# Patient Record
Sex: Male | Born: 1980
Health system: Southern US, Community
[De-identification: ages and names within clinical notes are randomized; demographics above are authoritative.]

## PROBLEM LIST (undated history)

## (undated) DIAGNOSIS — D696 Thrombocytopenia, unspecified: Secondary | ICD-10-CM

## (undated) DIAGNOSIS — E559 Vitamin D deficiency, unspecified: Secondary | ICD-10-CM

## (undated) HISTORY — DX: Vitamin D deficiency, unspecified: E55.9

## (undated) HISTORY — DX: Thrombocytopenia, unspecified: D69.6

---

## 2008-01-18 ENCOUNTER — Encounter: Admission: RE | Admit: 2008-01-18 | Discharge: 2008-01-18 | Payer: Self-pay | Admitting: Internal Medicine

## 2015-07-04 ENCOUNTER — Other Ambulatory Visit: Payer: 59

## 2015-07-04 ENCOUNTER — Other Ambulatory Visit (INDEPENDENT_AMBULATORY_CARE_PROVIDER_SITE_OTHER): Payer: 59

## 2015-07-04 ENCOUNTER — Encounter: Payer: Self-pay | Admitting: Internal Medicine

## 2015-07-04 ENCOUNTER — Ambulatory Visit (INDEPENDENT_AMBULATORY_CARE_PROVIDER_SITE_OTHER): Payer: 59 | Admitting: Internal Medicine

## 2015-07-04 VITALS — BP 120/68 | HR 78 | Temp 98.2°F | Resp 12 | Ht 65.0 in | Wt 159.0 lb

## 2015-07-04 DIAGNOSIS — D696 Thrombocytopenia, unspecified: Secondary | ICD-10-CM

## 2015-07-04 DIAGNOSIS — Z0000 Encounter for general adult medical examination without abnormal findings: Secondary | ICD-10-CM

## 2015-07-04 DIAGNOSIS — E559 Vitamin D deficiency, unspecified: Secondary | ICD-10-CM | POA: Diagnosis not present

## 2015-07-04 LAB — COMPREHENSIVE METABOLIC PANEL
ALBUMIN: 4.4 g/dL (ref 3.5–5.2)
ALK PHOS: 65 U/L (ref 39–117)
ALT: 23 U/L (ref 0–53)
AST: 20 U/L (ref 0–37)
BILIRUBIN TOTAL: 0.4 mg/dL (ref 0.2–1.2)
BUN: 14 mg/dL (ref 6–23)
CO2: 31 mEq/L (ref 19–32)
Calcium: 9.7 mg/dL (ref 8.4–10.5)
Chloride: 104 mEq/L (ref 96–112)
Creatinine, Ser: 0.9 mg/dL (ref 0.40–1.50)
GFR: 123.38 mL/min (ref >60.00)
GLUCOSE: 118 mg/dL — AB (ref 70–99)
Potassium: 4 mEq/L (ref 3.5–5.1)
SODIUM: 142 meq/L (ref 135–145)
TOTAL PROTEIN: 7.1 g/dL (ref 6.0–8.3)

## 2015-07-04 LAB — CBC
HCT: 45.9 % (ref 39.0–52.0)
HEMOGLOBIN: 15.5 g/dL (ref 13.0–17.0)
MCHC: 33.7 g/dL (ref 30.0–36.0)
MCV: 85.1 fl (ref 78.0–100.0)
Platelets: 160 10*3/uL (ref 150.0–400.0)
RBC: 5.39 Mil/uL (ref 4.22–5.81)
RDW: 12.6 % (ref 11.5–15.5)
WBC: 5.2 10*3/uL (ref 4.0–10.5)

## 2015-07-04 LAB — VITAMIN D 25 HYDROXY (VIT D DEFICIENCY, FRACTURES): VITD: 33.19 ng/mL (ref 30.00–100.00)

## 2015-07-04 LAB — HIV ANTIBODY (ROUTINE TESTING W REFLEX): HIV: NONREACTIVE

## 2015-07-04 NOTE — Patient Instructions (Signed)
We will check the labs today and call you back with the results.   If you are signed up for mychart you can get the results that way.   Come back in about 1 year for a check up and if you need anything sooner please feel free to call the office.

## 2015-07-04 NOTE — Progress Notes (Signed)
Pre visit review using our clinic review tool, if applicable. No additional management support is needed unless otherwise documented below in the visit note. 

## 2015-07-07 DIAGNOSIS — D693 Immune thrombocytopenic purpura: Secondary | ICD-10-CM | POA: Insufficient documentation

## 2015-07-07 DIAGNOSIS — E559 Vitamin D deficiency, unspecified: Secondary | ICD-10-CM | POA: Insufficient documentation

## 2015-07-07 NOTE — Assessment & Plan Note (Signed)
Unclear etiology and mildly decreased in the past. Rechecking today and screening for HIV, no easy bleeding or bruising. No family history of blood dyscrasias.

## 2015-07-07 NOTE — Progress Notes (Signed)
   Subjective:    Patient ID: Louis Jennings, male    DOB: [birth year], 34 y.o.   MRN: [id number]  HPI The patient is a 34 YO male coming in for follow up of his vit d deficiency and mild low platelets. Previously evaluated and nothing found to explain. No easy bleeding or bruising. Does not take any medications. 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   [facility], social history reviewed and updated.   Review of Systems  Constitutional: Negative for fever, activity change, appetite change and fatigue.  HENT: Negative.   Eyes: Negative.   Respiratory: Negative for cough, chest tightness, shortness of breath and wheezing.   Cardiovascular: Negative for chest pain, palpitations and leg swelling.  Gastrointestinal: Negative for nausea, abdominal pain, diarrhea, constipation and abdominal distention.  Musculoskeletal: Negative.   Skin: Negative.   Neurological: Negative.   Hematological: Negative.   Psychiatric/Behavioral: Negative.       Objective:   Physical Exam  Constitutional: He is oriented to person, place, and time. He appears well-developed and well-nourished.  HENT:  Head: Normocephalic and atraumatic.  Eyes: EOM are normal.  Neck: Normal range of motion.  Cardiovascular: Normal rate and regular rhythm.   No murmur heard. Pulmonary/Chest: Effort normal and breath sounds normal. No respiratory distress. He has no wheezes. He has no rales.  Abdominal: Soft. Bowel sounds are normal. He exhibits no distension. There is no tenderness. There is no rebound.  Musculoskeletal: He exhibits no edema.  Neurological: He is alert and oriented to person, place, and time. Coordination normal.  Skin: Skin is warm and dry.  Psychiatric: He has a normal mood and affect.   Filed Vitals:   07/04/15 1423  BP: 120/68  Pulse: 78  Temp: 98.2 F (36.8 C)  TempSrc: Oral  Resp: 12  Height: 5\' 5"  (1.651 m)  Weight: 159 lb (72.122 kg)  SpO2: 97%      Assessment & Plan:

## 2015-07-07 NOTE — Assessment & Plan Note (Signed)
Previously on high dose replacement and was supposed to be taking oral supplement daily which he is not right now. Checking level and may advise to get back on replacement.

## 2016-06-26 DIAGNOSIS — H5213 Myopia, bilateral: Secondary | ICD-10-CM | POA: Diagnosis not present

## 2017-07-10 DIAGNOSIS — H5213 Myopia, bilateral: Secondary | ICD-10-CM | POA: Diagnosis not present

## 2017-10-21 ENCOUNTER — Telehealth: Payer: Self-pay | Admitting: *Deleted

## 2017-10-21 NOTE — Telephone Encounter (Signed)
Please schedule

## 2017-10-21 NOTE — Telephone Encounter (Signed)
Fine

## 2017-10-21 NOTE — Telephone Encounter (Signed)
Pt states that he will call back to schedule his NP appt with Dr. Mardelle MatteAndy.

## 2017-10-21 NOTE — Telephone Encounter (Signed)
Copied from CRM [phone]. Topic: Inquiry >> Oct 21, 2017 10:46 AM Yvonna Alanisobinson, Andra M wrote: Reason for CRM: Patient called stating that he, along with his wife, would like their PCP care transferred from Dr. Hillard DankerElizabeth Crawford to Dr. Asencion Partridgeamille Andy at  [place]. Plesae contact the patient to transfer care.       Thank You!!!

## 2017-10-30 ENCOUNTER — Encounter: Payer: Self-pay | Admitting: Family Medicine

## 2017-10-30 ENCOUNTER — Ambulatory Visit: Payer: 59 | Admitting: Family Medicine

## 2017-10-30 ENCOUNTER — Other Ambulatory Visit: Payer: Self-pay

## 2017-10-30 VITALS — BP 132/80 | HR 67 | Temp 98.1°F | Ht 65.25 in | Wt 152.6 lb

## 2017-10-30 DIAGNOSIS — E559 Vitamin D deficiency, unspecified: Secondary | ICD-10-CM

## 2017-10-30 DIAGNOSIS — Z0000 Encounter for general adult medical examination without abnormal findings: Secondary | ICD-10-CM | POA: Diagnosis not present

## 2017-10-30 DIAGNOSIS — D693 Immune thrombocytopenic purpura: Secondary | ICD-10-CM | POA: Diagnosis not present

## 2017-10-30 LAB — CBC WITH DIFFERENTIAL/PLATELET
BASOS ABS: 0 10*3/uL (ref 0.0–0.1)
BASOS PCT: 0.4 % (ref 0.0–3.0)
EOS ABS: 0.1 10*3/uL (ref 0.0–0.7)
Eosinophils Relative: 2.5 % (ref 0.0–5.0)
HCT: 45.1 % (ref 39.0–52.0)
Hemoglobin: 15.4 g/dL (ref 13.0–17.0)
LYMPHS PCT: 38.5 % (ref 12.0–46.0)
Lymphs Abs: 1.6 10*3/uL (ref 0.7–4.0)
MCHC: 34.1 g/dL (ref 30.0–36.0)
MCV: 86.1 fl (ref 78.0–100.0)
MONO ABS: 0.2 10*3/uL (ref 0.1–1.0)
Monocytes Relative: 5.4 % (ref 3.0–12.0)
Neutro Abs: 2.2 10*3/uL (ref 1.4–7.7)
Neutrophils Relative %: 53.2 % (ref 43.0–77.0)
Platelets: 139 10*3/uL — ABNORMAL LOW (ref 150.0–400.0)
RBC: 5.24 Mil/uL (ref 4.22–5.81)
RDW: 12.6 % (ref 11.5–15.5)
WBC: 4.1 10*3/uL (ref 4.0–10.5)

## 2017-10-30 LAB — TSH: TSH: 1.06 u[IU]/mL (ref 0.35–4.50)

## 2017-10-30 LAB — COMPREHENSIVE METABOLIC PANEL
ALBUMIN: 4.4 g/dL (ref 3.5–5.2)
ALT: 30 U/L (ref 0–53)
AST: 24 U/L (ref 0–37)
Alkaline Phosphatase: 56 U/L (ref 39–117)
BILIRUBIN TOTAL: 0.6 mg/dL (ref 0.2–1.2)
BUN: 13 mg/dL (ref 6–23)
CHLORIDE: 103 meq/L (ref 96–112)
CO2: 34 meq/L — AB (ref 19–32)
CREATININE: 0.89 mg/dL (ref 0.40–1.50)
Calcium: 9.8 mg/dL (ref 8.4–10.5)
GFR: 123.37 mL/min (ref >60.00)
Glucose, Bld: 80 mg/dL (ref 70–99)
Potassium: 3.9 mEq/L (ref 3.5–5.1)
SODIUM: 142 meq/L (ref 135–145)
Total Protein: 7.1 g/dL (ref 6.0–8.3)

## 2017-10-30 LAB — LIPID PANEL
CHOL/HDL RATIO: 3
Cholesterol: 174 mg/dL (ref 0–200)
HDL: 51.4 mg/dL (ref >39.00)
LDL Cholesterol: 84 mg/dL (ref 0–99)
NonHDL: 122.41
Triglycerides: 190 mg/dL — ABNORMAL HIGH (ref 0.0–149.0)
VLDL: 38 mg/dL (ref 0.0–40.0)

## 2017-10-30 LAB — VITAMIN D 25 HYDROXY (VIT D DEFICIENCY, FRACTURES): VITD: 28.19 ng/mL — AB (ref 30.00–100.00)

## 2017-10-30 NOTE — Patient Instructions (Signed)
Please return in 1 year for your annual complete physical; please come fasting.   It was a pleasure meeting you today! Thank you for choosing us to meet your healthcare needs! I truly look forward to working with you. If you have any questions or concerns, please send me a message via Mychart or call the office at [phone].  Please do these things to maintain good health!   Exercise at least 30-45 minutes a day,  4-5 days a week.   Eat a low-fat diet with lots of fruits and vegetables, up to 7-9 servings per day.  Drink plenty of water daily. Try to drink 8 8oz glasses per day.  Seatbelts can save your life. Always wear your seatbelt.  Place Smoke Detectors on every level of your home and check batteries every year.  Eye Doctor - have an eye exam every 1-2 years  Safe sex - use condoms to protect yourself from STDs if you could be exposed to these types of infections.  Avoid heavy alcohol use. If you drink, keep it to less than 2 drinks/day and not every day.  Health Care Power of Attorney.  Choose someone you trust that could speak for you if you became unable to speak for yourself.  Depression is common in our stressful world.If you're feeling down or losing interest in things you normally enjoy, please come in for a visit.

## 2017-10-30 NOTE — Progress Notes (Signed)
Subjective  Chief Complaint  Patient presents with  . Establish Care    Transfer from Dr. Sharlet Salina   HPI: Louis Jennings is a 37 y.o. male who presents to [place] at [facility] today for a Male Wellness Visit.   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 Originally from [place]; married with 2 children. Happy. No health concerns.   Has h/o mild idiopathic thrombocytopenia. Evaluated by hematology in past. Never with clinical sxs. No bruising or bleeding or rash.  Has h/o vit D deficiency; lack of sunlight as suspected cause; on mvi daily now. No pain.   Lifestyle: Body mass index is 25.2 kg/m. Wt Readings from Last 3 Encounters:  10/30/17 152 lb 9.6 oz (69.2 kg)  07/04/15 159 lb (72.1 kg)   Diet: low fat Exercise: frequently, running/ jogging  Patient Active Problem List   Diagnosis Date Noted  . Vitamin D deficiency 07/07/2015  . Idiopathic thrombocytopenia ([place]) 07/07/2015   Health Maintenance  Topic Date Due  . TETANUS/TDAP  09/09/2020  . INFLUENZA VACCINE  Completed  . HIV Screening  Completed   Immunization History  Administered Date(s) Administered  . Hepatitis B 11/07/2003, 10/23/2007, 06/21/2008  . Influenza,inj,Quad PF,6+ Mos 07/14/2014  . Influenza-Unspecified 10/21/2007, 08/22/2008, 08/22/2008, 07/19/2013, 06/24/2015, 05/21/2017  . MMR 07/01/2007, 02/08/2015  . PPD Test 12/09/2013  . Td 10/21/2007, 01/01/2008, 06/22/2008  . Tdap 09/10/2007, 10/21/2007, 09/09/2010   We updated and reviewed the patient's past history in detail and it is documented below. Allergies: Patient has No Known Allergies. Past Medical History  has a past medical history of Thrombocytopenia ([place]) and Vitamin D deficiency. Past Surgical History  has no past surgical history on file. Social History Patient  reports that  has never  smoked. he has never used smokeless tobacco. He reports that he does not drink alcohol or use drugs. Family History Patient family history includes Healthy in his daughter and son. Review of Systems: Constitutional: negative for fever or malaise Ophthalmic: negative for photophobia, double vision or loss of vision Cardiovascular: negative for chest pain, dyspnea on exertion, or new LE swelling Respiratory: negative for SOB or persistent cough Gastrointestinal: negative for abdominal pain, change in bowel habits or melena Genitourinary: negative for dysuria or gross hematuria Musculoskeletal: negative for new gait disturbance or muscular weakness Integumentary: negative for new or persistent rashes, no breast lumps Neurological: negative for TIA or stroke symptoms Psychiatric: negative for SI or delusions Allergic/Immunologic: negative for hives  Patient Care Team    Relationship Specialty Notifications Start End  Leamon Arnt, MD PCP - General Family Medicine  10/30/17    Objective  Vitals: BP (!) 138/98   Pulse 67   Temp 98.1 F (36.7 C)   Ht 5' 5.25" (1.657 m)   Wt 152 lb 9.6 oz (69.2 kg)   BMI 25.20 kg/m  General:  Well developed, well nourished, no acute distress  Psych:  Alert and orientedx3,normal mood and affect HEENT:  Normocephalic, atraumatic, non-icteric sclera, PERRL, oropharynx is clear without mass or exudate, supple neck without adenopathy, mass or thyromegaly Cardiovascular:  Normal S1, S2, RRR without gallop, rub or murmur, nondisplaced PMI, +2 distal pulses in bilateral upper and lower extremities. Respiratory:  Good breath sounds bilaterally, CTAB with normal respiratory effort Gastrointestinal: normal bowel sounds, soft, non-tender, no noted masses. No HSM MSK: no deformities,  contusions. Joints are without erythema or swelling. Spine and CVA region are nontender Skin:  Warm, no rashes or suspicious lesions noted Neurologic:    Mental status is normal. CN  2-11 are normal. Gross motor and sensory exams are normal. Stable gait. No tremor GU: No inguinal hernias or adenopathy are appreciated bilaterally  Assessment  1. Annual physical exam   2. Vitamin D deficiency   3. Idiopathic thrombocytopenia [facility])      Plan  Male Wellness Visit:  Age appropriate Health Maintenance and Prevention measures were discussed with patient. Included topics are cancer screening recommendations, ways to keep healthy (see AVS) including dietary and exercise recommendations, regular eye and dental care, use of seat belts, and avoidance of moderate alcohol use and tobacco use.   BMI: discussed patient's BMI and encouraged positive lifestyle modifications to help get to or maintain a target BMI.  HM needs and immunizations were addressed and ordered. See below for orders. See HM and immunization section for updates.  Routine labs and screening tests ordered including cmp, cbc and lipids where appropriate.  Discussed recommendations regarding Vit D and calcium supplementation (see AVS)  Monitor BP outside of office.   Check vit D levels.   Follow up: Return in about 12 months (around 10/30/2018) for complete physical.   Commons side effects, risks, benefits, and alternatives for medications and treatment plan prescribed today were discussed, and the patient expressed understanding of the given instructions. Patient is instructed to call or message via MyChart if he/she has any questions or concerns regarding our treatment plan. No barriers to understanding were identified. We discussed Red Flag symptoms and signs in detail. Patient expressed understanding regarding what to do in case of urgent or emergency type symptoms.   Medication list was reconciled, printed and provided to the patient in AVS. Patient instructions and summary information was reviewed with the patient as documented in the AVS. This note was prepared with assistance of Dragon voice recognition  software. Occasional wrong-word or sound-a-like substitutions may have occurred due to the inherent limitations of voice recognition software  Orders Placed This Encounter  Procedures  . CBC with Differential/Platelet  . Comprehensive metabolic panel  . Lipid panel  . TSH  . VITAMIN D 25 Hydroxy (Vit-D Deficiency, Fractures)   No orders of the defined types were placed in this encounter.

## 2018-06-09 MED FILL — CHLORHEXIDINE 0.12% RINSE: 0.12 | 15 days supply | Qty: 473 | Fill #0

## 2018-07-08 MED FILL — CHLORHEXIDINE 0.12% RINSE: 0.12 | 15 days supply | Qty: 473 | Fill #1

## 2019-02-02 ENCOUNTER — Encounter: Payer: 59 | Admitting: Family Medicine

## 2019-02-10 ENCOUNTER — Other Ambulatory Visit: Payer: Self-pay

## 2019-02-10 ENCOUNTER — Encounter: Payer: Self-pay | Admitting: Family Medicine

## 2019-02-10 ENCOUNTER — Ambulatory Visit (INDEPENDENT_AMBULATORY_CARE_PROVIDER_SITE_OTHER): Payer: No Typology Code available for payment source | Admitting: Family Medicine

## 2019-02-10 VITALS — BP 121/79 | HR 71 | Temp 98.4°F | Ht 64.0 in | Wt 157.0 lb

## 2019-02-10 DIAGNOSIS — E559 Vitamin D deficiency, unspecified: Secondary | ICD-10-CM

## 2019-02-10 DIAGNOSIS — D693 Immune thrombocytopenic purpura: Secondary | ICD-10-CM | POA: Diagnosis not present

## 2019-02-10 DIAGNOSIS — Z0000 Encounter for general adult medical examination without abnormal findings: Secondary | ICD-10-CM | POA: Diagnosis not present

## 2019-02-10 LAB — CBC WITH DIFFERENTIAL/PLATELET
Basophils Absolute: 0 10*3/uL (ref 0.0–0.1)
Basophils Relative: 0.4 % (ref 0.0–3.0)
Eosinophils Absolute: 0.1 10*3/uL (ref 0.0–0.7)
Eosinophils Relative: 2.5 % (ref 0.0–5.0)
HCT: 43.6 % (ref 39.0–52.0)
Hemoglobin: 15.1 g/dL (ref 13.0–17.0)
Lymphocytes Relative: 30.5 % (ref 12.0–46.0)
Lymphs Abs: 1.3 10*3/uL (ref 0.7–4.0)
MCHC: 34.5 g/dL (ref 30.0–36.0)
MCV: 86.3 fl (ref 78.0–100.0)
Monocytes Absolute: 0.3 10*3/uL (ref 0.1–1.0)
Monocytes Relative: 7.6 % (ref 3.0–12.0)
Neutro Abs: 2.6 10*3/uL (ref 1.4–7.7)
Neutrophils Relative %: 59 % (ref 43.0–77.0)
Platelets: 162 10*3/uL (ref 150.0–400.0)
RBC: 5.05 Mil/uL (ref 4.22–5.81)
RDW: 13.1 % (ref 11.5–15.5)
WBC: 4.4 10*3/uL (ref 4.0–10.5)

## 2019-02-10 LAB — VITAMIN D 25 HYDROXY (VIT D DEFICIENCY, FRACTURES): VITD: 28.43 ng/mL — ABNORMAL LOW (ref 30.00–100.00)

## 2019-02-10 LAB — LIPID PANEL
Cholesterol: 190 mg/dL (ref 0–200)
HDL: 44.3 mg/dL (ref >39.00)
NonHDL: 145.55
Total CHOL/HDL Ratio: 4
Triglycerides: 226 mg/dL — ABNORMAL HIGH (ref 0.0–149.0)
VLDL: 45.2 mg/dL — ABNORMAL HIGH (ref 0.0–40.0)

## 2019-02-10 LAB — COMPREHENSIVE METABOLIC PANEL
ALT: 18 U/L (ref 0–53)
AST: 21 U/L (ref 0–37)
Albumin: 4.3 g/dL (ref 3.5–5.2)
Alkaline Phosphatase: 61 U/L (ref 39–117)
BUN: 15 mg/dL (ref 6–23)
CO2: 29 mEq/L (ref 19–32)
Calcium: 9.5 mg/dL (ref 8.4–10.5)
Chloride: 105 mEq/L (ref 96–112)
Creatinine, Ser: 0.88 mg/dL (ref 0.40–1.50)
GFR: 116.8 mL/min (ref >60.00)
Glucose, Bld: 90 mg/dL (ref 70–99)
Potassium: 4 mEq/L (ref 3.5–5.1)
Sodium: 141 mEq/L (ref 135–145)
Total Bilirubin: 0.5 mg/dL (ref 0.2–1.2)
Total Protein: 6.7 g/dL (ref 6.0–8.3)

## 2019-02-10 LAB — LDL CHOLESTEROL, DIRECT: Direct LDL: 103 mg/dL

## 2019-02-10 NOTE — Progress Notes (Signed)
Subjective  Chief Complaint  Patient presents with  . Annual Exam   HPI: Louis Jennings is a 38 y.o. male who presents to [place] at [place] today for a Male Wellness Visit.   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  Overall he continues to thrive.  He has no concerns.  He is fasting for lab work today.  Lives a healthy lifestyle.  Tries exercise.  History of mild vitamin D deficiency; he takes vitamin D intermittently.  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 Lifestyle: Body mass index is 26.95 kg/m. Wt Readings from Last 3 Encounters:  02/10/19 157 lb (71.2 kg)  10/30/17 152 lb 9.6 oz (69.2 kg)  07/04/15 159 lb (72.1 kg)     Patient Active Problem List   Diagnosis Date Noted  . Vitamin D deficiency 07/07/2015  . Idiopathic thrombocytopenia ([place]) 07/07/2015   Health Maintenance  Topic Date Due  . INFLUENZA VACCINE  04/10/2019  . TETANUS/TDAP  09/09/2020  . HIV Screening  Completed   Immunization History  Administered Date(s) Administered  . Hepatitis B 11/07/2003, 10/23/2007, 06/21/2008  . Influenza,inj,Quad PF,6+ Mos 07/14/2014  . Influenza-Unspecified 10/21/2007, 08/22/2008, 08/22/2008, 07/19/2013, 06/24/2015, 05/21/2017, 05/28/2018  . MMR 07/01/2007, 02/08/2015  . PPD Test 12/09/2013  . Td 10/21/2007, 01/01/2008, 06/22/2008  . Tdap 09/10/2007, 10/21/2007, 09/09/2010   We updated and reviewed the patient's past history in detail and it is documented below. Allergies: Patient has No Known Allergies. Past Medical History  has a past medical history of Thrombocytopenia ([place]) and Vitamin D deficiency. Past Surgical History  has no past surgical history on file. Social History Patient  reports that he has never smoked. He has never used smokeless tobacco. He reports that he does not drink alcohol or use drugs. Family  History Patient family history includes Healthy in his daughter and son. Review of Systems: Constitutional: negative for fever or malaise Ophthalmic: negative for photophobia, double vision or loss of vision Cardiovascular: negative for chest pain, dyspnea on exertion, or new LE swelling Respiratory: negative for SOB or persistent cough Gastrointestinal: negative for abdominal pain, change in bowel habits or melena Genitourinary: negative for dysuria or gross hematuria Musculoskeletal: negative for new gait disturbance or muscular weakness Integumentary: negative for new or persistent rashes, no breast lumps Neurological: negative for TIA or stroke symptoms Psychiatric: negative for SI or delusions Allergic/Immunologic: negative for hives  Patient Care Team    Relationship Specialty Notifications Start End  Leamon Arnt, MD PCP - General Family Medicine  10/30/17    Objective  Vitals: BP 121/79 (BP Location: Right Arm, Patient Position: Sitting, Cuff Size: Normal)   Pulse 71   Temp 98.4 F (36.9 C) (Oral)   Ht '5\' 4"'  (1.626 m)   Wt 157 lb (71.2 kg)   SpO2 97%   BMI 26.95 kg/m  General:  Well developed, well nourished, no acute distress  Psych:  Alert and orientedx3,normal mood and affect HEENT:  Normocephalic, atraumatic, non-icteric sclera, PERRL, oropharynx is clear without mass or exudate, supple neck without adenopathy, mass or thyromegaly Cardiovascular:  Normal S1, S2, RRR without gallop, rub or murmur, nondisplaced PMI, +2 distal pulses in bilateral upper and lower extremities. Respiratory:  Good breath sounds bilaterally, CTAB with normal respiratory effort Gastrointestinal: normal bowel sounds, soft, non-tender, no noted masses. No HSM MSK: no deformities, contusions. Joints  are without erythema or swelling. Spine and CVA region are nontender Skin:  Warm, no rashes or suspicious lesions noted Neurologic:    Mental status is normal. CN 2-11 are normal. Gross motor and  sensory exams are normal. Stable gait. No tremor GU: No inguinal hernias or adenopathy are appreciated bilaterally  Assessment  1. Annual physical exam   2. Vitamin D deficiency   3. Idiopathic thrombocytopenia [facility])      Plan  Male Wellness Visit:  Age appropriate Health Maintenance and Prevention measures were discussed with patient. Included topics are cancer screening recommendations, ways to keep healthy (see AVS) including dietary and exercise recommendations, regular eye and dental care, use of seat belts, and avoidance of moderate alcohol use and tobacco use.   BMI: discussed patient's BMI and encouraged positive lifestyle modifications to help get to or maintain a target BMI.  HM needs and immunizations were addressed and ordered. See below for orders. See HM and immunization section for updates.  Routine labs and screening tests ordered including cmp, cbc and lipids where appropriate.  Add vitamin D  Discussed recommendations regarding Vit D and calcium supplementation (see AVS)  Follow up: Return in about 1 year (around 02/10/2020) for complete physical.   Commons side effects, risks, benefits, and alternatives for medications and treatment plan prescribed today were discussed, and the patient expressed understanding of the given instructions. Patient is instructed to call or message via MyChart if he/she has any questions or concerns regarding our treatment plan. No barriers to understanding were identified. We discussed Red Flag symptoms and signs in detail. Patient expressed understanding regarding what to do in case of urgent or emergency type symptoms.   Medication list was reconciled, printed and provided to the patient in AVS. Patient instructions and summary information was reviewed with the patient as documented in the AVS. This note was prepared with assistance of Dragon voice recognition software. Occasional wrong-word or sound-a-like substitutions may have occurred due  to the inherent limitations of voice recognition software  Orders Placed This Encounter  Procedures  . CBC with Differential/Platelet  . Comprehensive metabolic panel  . Lipid panel  . VITAMIN D 25 Hydroxy (Vit-D Deficiency, Fractures)   No orders of the defined types were placed in this encounter.

## 2019-02-10 NOTE — Patient Instructions (Signed)
Please return in 12 months for your annual complete physical; please come fasting.  I will release your lab results to you on your MyChart account with further instructions. Please reply with any questions.    If you have any questions or concerns, please don't hesitate to send me a message via MyChart or call the office at [phone]. Thank you for visiting with [place] today! It's our pleasure caring for you.   Preventive Care 18-39 Years, Male Preventive care refers to lifestyle choices and visits with your health care provider that can promote health and wellness. What does preventive care include?   A yearly physical exam. This is also called an annual well check.  Dental exams once or twice a year.  Routine eye exams. Ask your health care provider how often you should have your eyes checked.  Personal lifestyle choices, including: ? Daily care of your teeth and gums. ? Regular physical activity. ? Eating a healthy diet. ? Avoiding tobacco and drug use. ? Limiting alcohol use. ? Practicing safe sex. What happens during an annual well check? The services and screenings done by your health care provider during your annual well check will depend on your age, overall health, lifestyle risk factors, and family history of disease. Counseling Your health care provider may ask you questions about your:  Alcohol use.  Tobacco use.  Drug use.  Emotional well-being.  Home and relationship well-being.  Sexual activity.  Eating habits.  Work and work Statistician. Screening You may have the following tests or measurements:  Height, weight, and BMI.  Blood pressure.  Lipid and cholesterol levels. These may be checked every 5 years starting at age 23.  Diabetes screening. This is done by checking your blood sugar (glucose) after you have not eaten for a while (fasting).  Skin check.  Hepatitis C blood test.  Hepatitis B blood test.  Sexually transmitted disease (STD)  testing. Discuss your test results, treatment options, and if necessary, the need for more tests with your health care provider. Vaccines Your health care provider may recommend certain vaccines, such as:  Influenza vaccine. This is recommended every year.  Tetanus, diphtheria, and acellular pertussis (Tdap, Td) vaccine. You may need a Td booster every 10 years.  Varicella vaccine. You may need this if you have not been vaccinated.  HPV vaccine. If you are 68 or younger, you may need three doses over 6 months.  Measles, mumps, and rubella (MMR) vaccine. You may need at least one dose of MMR.You may also need a second dose.  Pneumococcal 13-valent conjugate (PCV13) vaccine. You may need this if you have certain conditions and have not been vaccinated.  Pneumococcal polysaccharide (PPSV23) vaccine. You may need one or two doses if you smoke cigarettes or if you have certain conditions.  Meningococcal vaccine. One dose is recommended if you are age 61-21 years and a first-year college student living in a residence hall, or if you have one of several medical conditions. You may also need additional booster doses.  Hepatitis A vaccine. You may need this if you have certain conditions or if you travel or work in places where you may be exposed to hepatitis A.  Hepatitis B vaccine. You may need this if you have certain conditions or if you travel or work in places where you may be exposed to hepatitis B.  Haemophilus influenzae type b (Hib) vaccine. You may need this if you have certain risk factors. Talk to your health care provider about  which screenings and vaccines you need and how often you need them. This information is not intended to replace advice given to you by your health care provider. Make sure you discuss any questions you have with your health care provider. Document Released: 10/22/2001 Document Revised: 04/08/2017 Document Reviewed: 06/27/2015 Elsevier Interactive Patient  Education  2019 [employer].

## 2020-03-09 ENCOUNTER — Other Ambulatory Visit: Payer: No Typology Code available for payment source

## 2020-05-08 ENCOUNTER — Encounter: Payer: Self-pay | Admitting: Family Medicine

## 2020-05-08 ENCOUNTER — Other Ambulatory Visit: Payer: Self-pay

## 2020-05-08 ENCOUNTER — Ambulatory Visit (INDEPENDENT_AMBULATORY_CARE_PROVIDER_SITE_OTHER): Payer: No Typology Code available for payment source | Admitting: Family Medicine

## 2020-05-08 VITALS — BP 120/60 | HR 56 | Temp 97.9°F | Resp 16 | Ht 64.0 in | Wt 156.4 lb

## 2020-05-08 DIAGNOSIS — Z0000 Encounter for general adult medical examination without abnormal findings: Secondary | ICD-10-CM

## 2020-05-08 DIAGNOSIS — D693 Immune thrombocytopenic purpura: Secondary | ICD-10-CM | POA: Diagnosis not present

## 2020-05-08 DIAGNOSIS — Z1159 Encounter for screening for other viral diseases: Secondary | ICD-10-CM

## 2020-05-08 NOTE — Patient Instructions (Addendum)
Please return in 12 months for your annual complete physical; please come fasting.  I will release your lab results to you on your MyChart account with further instructions. Please reply with any questions.   Glad you are well!  If you have any questions or concerns, please don't hesitate to send me a message via MyChart or call the office at [phone]. Thank you for visiting with us today! It's our pleasure caring for you.   

## 2020-05-08 NOTE — Progress Notes (Signed)
Subjective  Chief Complaint  Patient presents with  . Annual Exam  . Vitamin D deficiency   HPI: Louis Jennings is a 39 y.o. male who presents to [facility] today for a Male Wellness Visit.   Wellness Visit: annual visit with health maintenance review and exam    Health maintenance: Hepatitis screen candidate.  Due influenza vaccine this season.  Eye exam up-to-date.  Healthy lifestyle.  No concerns. Lifestyle: Body mass index is 26.85 kg/m. Wt Readings from Last 3 Encounters:  05/08/20 156 lb 6.4 oz (70.9 kg)  02/10/19 157 lb (71.2 kg)  10/30/17 152 lb 9.6 oz (69.2 kg)   Diet: low fat Exercise: frequently,    Patient Active Problem List   Diagnosis Date Noted  . Vitamin D deficiency 07/07/2015  . 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 Hepatitis C Screening  Never done  . INFLUENZA VACCINE  04/09/2020  . TETANUS/TDAP  09/09/2020  . HIV Screening  Completed   Immunization History  Administered Date(s) Administered  . Hepatitis B 11/07/2003, 10/23/2007, 06/21/2008  . Influenza,inj,Quad PF,6+ Mos 07/14/2014  . Influenza-Unspecified 10/21/2007, 08/22/2008, 08/22/2008, 07/19/2013, 06/24/2015, 05/21/2017, 05/28/2018  . MMR 07/01/2007, 02/08/2015  . PPD Test 12/09/2013  . Td 10/21/2007, 01/01/2008, 06/22/2008  . Tdap 09/10/2007, 10/21/2007, 09/09/2010   We updated and reviewed the patient's past history in detail and it is documented below. Allergies: Patient has No Known Allergies. Past Medical History  has a past medical history of Thrombocytopenia ([place]) and Vitamin D deficiency. Past Surgical History  has no past surgical history on file. Social History Patient  reports that he has never smoked. He has never used smokeless tobacco. He reports that he does not drink alcohol and does not use drugs. Family History Patient family history includes Healthy in his daughter and son. Review of  Systems: Constitutional: negative for fever or malaise Ophthalmic: negative for photophobia, double vision or loss of vision Cardiovascular: negative for chest pain, dyspnea on exertion, or new LE swelling Respiratory: negative for SOB or persistent cough Gastrointestinal: negative for abdominal pain, change in bowel habits or melena Genitourinary: negative for dysuria or gross hematuria Musculoskeletal: negative for new gait disturbance or muscular weakness Integumentary: negative for new or persistent rashes, no breast lumps Neurological: negative for TIA or stroke symptoms Psychiatric: negative for SI or delusions Allergic/Immunologic: negative for hives  Patient Care Team    Relationship Specialty Notifications Start End  Leamon Arnt, MD PCP - General Family Medicine  10/30/17    Objective  Vitals: BP 120/60   Pulse (!) 56   Temp 97.9 F (36.6 C) (Temporal)   Resp 16   Ht '5\' 4"'  (1.626 m)   Wt 156 lb 6.4 oz (70.9 kg)   SpO2 100%   BMI 26.85 kg/m  General:  Well developed, well nourished, no acute distress  Psych:  Alert and orientedx3,normal mood and affect HEENT:  Normocephalic, atraumatic, non-icteric sclera, PERRL, oropharynx is clear without mass or exudate, supple neck without adenopathy, mass or thyromegaly Cardiovascular:  Normal S1, S2, RRR without gallop, rub or murmur, nondisplaced PMI, +2 distal pulses in bilateral upper and lower extremities. Respiratory:  Good breath sounds bilaterally, CTAB with normal respiratory effort Gastrointestinal: normal bowel sounds, soft, non-tender, no noted masses. No HSM MSK: no deformities, contusions. Joints are without erythema or swelling. Spine and CVA region are nontender Skin:  Warm, no rashes or suspicious lesions noted Neurologic:  Mental status is normal. CN 2-11 are normal. Gross motor and sensory exams are normal. Stable gait. No tremor GU: No inguinal hernias or adenopathy are appreciated bilaterally  Assessment    1. Annual physical exam   2. Idiopathic thrombocytopenia [facility])      Plan  Male Wellness Visit:  Age appropriate Health Maintenance and Prevention measures were discussed with patient. Included topics are cancer screening recommendations, ways to keep healthy (see AVS) including dietary and exercise recommendations, regular eye and dental care, use of seat belts, and avoidance of moderate alcohol use and tobacco use.   BMI: discussed patient's BMI and encouraged positive lifestyle modifications to help get to or maintain a target BMI.  HM needs and immunizations were addressed and ordered. See below for orders. See HM and immunization section for updates.  Routine labs and screening tests ordered including cmp, cbc and lipids where appropriate.  Discussed recommendations regarding Vit D and calcium supplementation (see AVS)  Follow up: Return in about 1 year (around 05/08/2021) for complete physical.   Commons side effects, risks, benefits, and alternatives for medications and treatment plan prescribed today were discussed, and the patient expressed understanding of the given instructions. Patient is instructed to call or message via MyChart if he/she has any questions or concerns regarding our treatment plan. No barriers to understanding were identified. We discussed Red Flag symptoms and signs in detail. Patient expressed understanding regarding what to do in case of urgent or emergency type symptoms.   Medication list was reconciled, printed and provided to the patient in AVS. Patient instructions and summary information was reviewed with the patient as documented in the AVS. This note was prepared with assistance of Dragon voice recognition software. Occasional wrong-word or sound-a-like substitutions may have occurred due to the inherent limitations of voice recognition software This visit occurred during the SARS-CoV-2 public health emergency.  Safety protocols were in place, including  screening questions prior to the visit, additional usage of staff PPE, and extensive cleaning of exam room while observing appropriate contact time as indicated for disinfecting solutions.   Orders Placed This Encounter  Procedures  . COMPLETE METABOLIC PANEL WITH GFR  . CBC with Differential/Platelet  . Lipid panel   No orders of the defined types were placed in this encounter.

## 2020-05-09 LAB — CBC WITH DIFFERENTIAL/PLATELET
Absolute Monocytes: 322 cells/uL (ref 200–950)
Basophils Absolute: 18 cells/uL (ref 0–200)
Basophils Relative: 0.4 %
Eosinophils Absolute: 152 cells/uL (ref 15–500)
Eosinophils Relative: 3.3 %
HCT: 41.1 % (ref 38.5–50.0)
Hemoglobin: 13.8 g/dL (ref 13.2–17.1)
Lymphs Abs: 1638 cells/uL (ref 850–3900)
MCH: 29.7 pg (ref 27.0–33.0)
MCHC: 33.6 g/dL (ref 32.0–36.0)
MCV: 88.6 fL (ref 80.0–100.0)
MPV: 12 fL (ref 7.5–12.5)
Monocytes Relative: 7 %
Neutro Abs: 2470 cells/uL (ref 1500–7800)
Neutrophils Relative %: 53.7 %
Platelets: 139 10*3/uL — ABNORMAL LOW (ref 140–400)
RBC: 4.64 10*6/uL (ref 4.20–5.80)
RDW: 12.8 % (ref 11.0–15.0)
Total Lymphocyte: 35.6 %
WBC: 4.6 10*3/uL (ref 3.8–10.8)

## 2020-05-09 LAB — COMPLETE METABOLIC PANEL WITH GFR
AG Ratio: 2 (calc) (ref 1.0–2.5)
ALT: 22 U/L (ref 9–46)
AST: 34 U/L (ref 10–40)
Albumin: 4.1 g/dL (ref 3.6–5.1)
Alkaline phosphatase (APISO): 55 U/L (ref 36–130)
BUN: 18 mg/dL (ref 7–25)
CO2: 30 mmol/L (ref 20–32)
Calcium: 9 mg/dL (ref 8.6–10.3)
Chloride: 106 mmol/L (ref 98–110)
Creat: 0.98 mg/dL (ref 0.60–1.35)
GFR, Est African American: 111 mL/min/{1.73_m2} (ref >=60)
GFR, Est Non African American: 96 mL/min/{1.73_m2} (ref >=60)
Globulin: 2.1 g/dL (calc) (ref 1.9–3.7)
Glucose, Bld: 86 mg/dL (ref 65–99)
Potassium: 4.2 mmol/L (ref 3.5–5.3)
Sodium: 140 mmol/L (ref 135–146)
Total Bilirubin: 0.6 mg/dL (ref 0.2–1.2)
Total Protein: 6.2 g/dL (ref 6.1–8.1)

## 2020-05-09 LAB — LIPID PANEL
Cholesterol: 144 mg/dL (ref <200)
HDL: 44 mg/dL (ref >=40)
LDL Cholesterol (Calc): 85 mg/dL (calc)
Non-HDL Cholesterol (Calc): 100 mg/dL (calc) (ref <130)
Total CHOL/HDL Ratio: 3.3 (calc) (ref <5.0)
Triglycerides: 67 mg/dL (ref <150)

## 2020-05-09 LAB — HEPATITIS C ANTIBODY
Hepatitis C Ab: NONREACTIVE
SIGNAL TO CUT-OFF: 0.01 (ref <1.00)

## 2020-08-23 ENCOUNTER — Encounter: Payer: Self-pay | Admitting: Family Medicine

## 2020-08-23 ENCOUNTER — Other Ambulatory Visit (HOSPITAL_COMMUNITY): Payer: Self-pay | Admitting: Family Medicine

## 2020-08-23 MED ORDER — DOXYCYCLINE HYCLATE 100 MG PO TABS: ORAL_TABLET | ORAL | 0 refills | Status: DC

## 2020-08-23 MED FILL — DOXYCYCLINE HYCLATE 100 MG: 100 | 90 days supply | Qty: 90 | Fill #0

## 2020-11-18 ENCOUNTER — Other Ambulatory Visit (HOSPITAL_COMMUNITY): Payer: Self-pay | Admitting: Pharmacist

## 2020-11-18 MED FILL — CARESTART COVID-19 HOME TES: 4 days supply | Qty: 4 | Fill #0

## 2020-12-05 ENCOUNTER — Ambulatory Visit: Payer: No Typology Code available for payment source | Admitting: Family Medicine

## 2020-12-05 ENCOUNTER — Ambulatory Visit (INDEPENDENT_AMBULATORY_CARE_PROVIDER_SITE_OTHER): Payer: No Typology Code available for payment source

## 2020-12-05 ENCOUNTER — Ambulatory Visit (INDEPENDENT_AMBULATORY_CARE_PROVIDER_SITE_OTHER): Payer: No Typology Code available for payment source | Admitting: Family Medicine

## 2020-12-05 ENCOUNTER — Other Ambulatory Visit: Payer: Self-pay

## 2020-12-05 ENCOUNTER — Encounter: Payer: Self-pay | Admitting: Family Medicine

## 2020-12-05 VITALS — BP 143/85 | Temp 98.3°F | Ht 64.0 in

## 2020-12-05 DIAGNOSIS — M25572 Pain in left ankle and joints of left foot: Secondary | ICD-10-CM

## 2020-12-05 NOTE — Progress Notes (Signed)
   Louis Jennings is a 40 y.o. male who presents today for an office visit.  Assessment/Plan:  Left ankle pain X-ray without obvious fracture based on my read.  Will await radiology read.  Will place in splint today.  Recommended nonweightbearing for the next couple of days and then as tolerated.  Recommended ice, compression, NSAIDs, and elevation.  Given his degree of pain will have him follow-up with sports medicine in 1 to 2 weeks.    Subjective:  HPI:  Patient with left ankle pain.  Started yesterday.  Was playing soccer and inverted his ankle.  Had immediate pain.  Felt a popping sensation.  Has not been able to bear weight since yesterday evening.  No pain at rest.  Pain is worse with weightbearing.  Some swelling to the area.  He has tried naproxen at home with some improvement.       Objective:  Physical Exam: Temp 98.3 F (36.8 C) (Temporal)   Ht 5\' 4"  (1.626 m)   BMI 26.85 kg/m   Gen: No acute distress, resting comfortably MSK: Left ankle grossly edematous.  Tender to palpation along posterior edge of medial malleolus.  Neurovascular intact distally. Neuro: Grossly normal, moves all extremities Psych: Normal affect and thought content      Adriaan Maltese M. , MD 12/05/2020 10:53 AM

## 2020-12-05 NOTE — Telephone Encounter (Signed)
Patient would like to know if he can be worked in today

## 2020-12-05 NOTE — Telephone Encounter (Signed)
Please call and ask to come at 10:30 or 11:00; can double book. Thanks!

## 2020-12-05 NOTE — Patient Instructions (Signed)
It was very nice to see you today!  We will put you a splint today.  I will refer you to see sports medicine in a week or two.   Take care, Dr Jimmey Ralph

## 2020-12-12 NOTE — Progress Notes (Signed)
I, Louis Jennings, LAT, ATC acting as a scribe for Louis Graham, MD.  Subjective:   I'm seeing this patient as a consultation for Dr. Jacquiline Jennings. Note will be routed back to referring provider/PCP.  CC: Left ankle pain  HPI: Pt is a 40 y/o male c/o L ankle pain ongoing x 9 days. MOI: Pt suffered an inversion mechanism while playing soccer when make a turn. Pt reports hearing a "cracking" sounds. Pt locates pain to along the medial malleolus, up into tibia and around lateral malleolus.   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  He is unable to walk normally.  L ankle swelling: yes Treatments tried: splint, NWB, ice, compression, NSAIDs, elevation  Dx imaging: 12/05/20 L ankle XR  Past medical history, Surgical history, Family history, Social history, Allergies, and medications have been entered into the medical record, reviewed.   Review of Systems: No new headache, visual changes, nausea, vomiting, diarrhea, constipation, dizziness, abdominal pain, skin rash, fevers, chills, night sweats, weight loss, swollen lymph nodes, body aches, joint swelling, muscle aches, chest pain, shortness of breath, mood changes, visual or auditory hallucinations.   Objective:    Vitals:   12/13/20 1249  BP: 128/86  Pulse: (!) 52  SpO2: 99%   General: Well Developed, well nourished, and in no acute distress.  Neuro/Psych: Alert and oriented x3, extra-ocular muscles intact, able to move all 4 extremities, sensation grossly intact. Skin: Warm and dry, no rashes noted.  Respiratory: Not using accessory muscles, speaking in full sentences, trachea midline.  Cardiovascular: Pulses palpable, no extremity edema. Abdomen: Does not appear distended. MSK: Left ankle swollen and bruising medial and lateral. Tender palpation lateral and medial malleoli. Decreased ankle motion. Stable ligamentous exam however guarding during exam testing nondiagnostic. Strength testing  intact. Pulses capillary fill and sensation are intact distally.  Lab and Radiology Results  X-ray images left ankle obtained today personally and independently interpreted. Intact ankle mortise.  Possible avulsion fracture at medial talus.  Tiny avulsion fleck visible. Corticated old avulsion fragment at lateral malleolus visible indicates old injury. Await formal radiology review  Diagnostic Limited MSK Ultrasound of: Left ankle Soft tissue swelling present medial and lateral ankle. Mild ankle effusion present. Intact posterior tibialis tendon however hypoechoic fluid tracks within tendon sheath. Intact peroneal tendons. Intact bony structures. Impression: Significant ankle sprain   Impression and Recommendations:    Assessment and Plan: 40 y.o. male with left ankle injury.  Patient suffered a severe inversion injury approximately 9 days ago.  He felt and heard a pop and now has continued ankle pain swelling and bruising.  He has a lot of pain medially which is not typical for the usual ankle sprain.  Repeat x-ray today per my read is concerning for a small avulsion fracture on the medial talus however radiology overread is still pending.  Bedside MSK ultrasound shows soft tissue swelling but no obvious tendon disruption.  Discussed options.  Plan to transition to cam walker boot and proceed with physical therapy.  If not significantly improving in the next 2 weeks would recommend MRI to further characterize ankle pain and injury.  I am concerned he may have a worse injury than we are appreciating.  If doing well recheck in 1 month.  PDMP not reviewed this encounter. Orders Placed This Encounter  Procedures  . [place] LIMITED JOINT SPACE STRUCTURES LOW LEFT(NO LINKED CHARGES)    Standing Status:   Future  Number of Occurrences:   1    Standing Expiration Date:   06/14/2021    Order Specific Question:   Reason for Exam (SYMPTOM  OR DIAGNOSIS REQUIRED)    Answer:   left ankle pain     Order Specific Question:   Preferred imaging location?    Answer:   Adult nurse Sports Medicine[facility]  . DG Ankle Complete Left    Standing Status:   Future    Number of Occurrences:   1    Standing Expiration Date:   12/13/2021    Order Specific Question:   Reason for Exam (SYMPTOM  OR DIAGNOSIS REQUIRED)    Answer:   eval poss anle fx    Order Specific Question:   Preferred imaging location?    Answer:   [facility]  . Ambulatory referral to Physical Therapy    Referral Priority:   Routine    Referral Type:   Physical Medicine    Referral Reason:   Specialty Services Required    Requested Specialty:   Physical Therapy   Meds ordered this encounter  Medications  . AMBULATORY NON FORMULARY MEDICATION    Sig: Cam walker boot    Dispense:  1 each    Refill:  0    Discussed warning signs or symptoms. Please see discharge instructions. Patient expresses understanding.   The above documentation has been reviewed and is accurate and complete Louis Jennings, M.D.

## 2020-12-13 ENCOUNTER — Ambulatory Visit: Payer: No Typology Code available for payment source | Admitting: Family Medicine

## 2020-12-13 ENCOUNTER — Other Ambulatory Visit: Payer: Self-pay

## 2020-12-13 ENCOUNTER — Ambulatory Visit: Payer: Self-pay

## 2020-12-13 ENCOUNTER — Ambulatory Visit (INDEPENDENT_AMBULATORY_CARE_PROVIDER_SITE_OTHER): Payer: No Typology Code available for payment source

## 2020-12-13 VITALS — BP 128/86 | HR 52 | Ht 64.0 in | Wt 156.6 lb

## 2020-12-13 DIAGNOSIS — M25572 Pain in left ankle and joints of left foot: Secondary | ICD-10-CM

## 2020-12-13 DIAGNOSIS — S93422A Sprain of deltoid ligament of left ankle, initial encounter: Secondary | ICD-10-CM | POA: Diagnosis not present

## 2020-12-13 MED ORDER — AMBULATORY NON FORMULARY MEDICATION: 0 refills | Status: DC

## 2020-12-13 NOTE — Patient Instructions (Addendum)
Thank you for coming in today.  I've referred you to Physical Therapy.  Let us know if you don't hear from them in one week.  Please get an Xray today before you leave  Use cam walker boot as needed.   Please go to [facility] supply to get the cam walker boot we talked about today. You may also be able to get it from [employer].   Keep me updated.   If not improving in 2-3 weeks I will proceed to MRI.   Recheck following MRI or in 1 month.

## 2020-12-15 NOTE — Progress Notes (Signed)
Radiology is concern for a small avulsion fracture at the talus.  This was not seen on prior x-rays.  This would make more sense based on the degree of pain and swelling you have. Cam walker boot as needed would be helpful.  Plan for physical therapy.  Recheck in 1 month or sooner if needed.

## 2020-12-19 ENCOUNTER — Ambulatory Visit: Payer: No Typology Code available for payment source | Admitting: Rehabilitative and Restorative Service Providers"

## 2020-12-26 ENCOUNTER — Other Ambulatory Visit: Payer: Self-pay

## 2020-12-26 ENCOUNTER — Encounter: Payer: Self-pay | Admitting: Rehabilitative and Restorative Service Providers"

## 2020-12-26 ENCOUNTER — Ambulatory Visit: Payer: No Typology Code available for payment source | Admitting: Rehabilitative and Restorative Service Providers"

## 2020-12-26 DIAGNOSIS — R278 Other lack of coordination: Secondary | ICD-10-CM

## 2020-12-26 DIAGNOSIS — M6281 Muscle weakness (generalized): Secondary | ICD-10-CM

## 2020-12-26 DIAGNOSIS — M25572 Pain in left ankle and joints of left foot: Secondary | ICD-10-CM

## 2020-12-26 NOTE — Therapy (Signed)
[facility] Physical Therapy [address] [place], [place], [address] Phone: [phone]   Fax:  [phone]  Physical Therapy Evaluation  Patient Details  Name: Louis Jennings MRN: [id number] Date of Birth: 02-04-1981 Referring Provider (PT): [name], MD   Encounter Date: 12/26/2020   PT End of Session - 12/26/20 1624    Visit Number 1    Number of Visits 10    Date for PT Re-Evaluation 02/06/21    Progress Note Due on Visit 10    PT Start Time 1147    PT Stop Time 1229    PT Time Calculation (min) 42 min    Activity Tolerance Patient tolerated treatment well;Patient limited by pain    Behavior During Therapy [facility] for tasks assessed/performed           Past Medical History:  Diagnosis Date  . Thrombocytopenia ([facility])   . Vitamin D deficiency     History reviewed. No pertinent surgical history.  There were no vitals filed for this visit.    Subjective Assessment - 12/26/20 1232    Subjective I am nervous about using my left foot. Hurt it 12/04/20    Pertinent History 12/04/20 pt had inversion ankle injury playing soccer. Used a knee scooter at work along with trying splint, being NWBB, icing, compression and NSAIDS. Xray showed possible small avulsion fracture medial talus. Placed in a CAM boot and referred to PT. if no improvement, MD to consider MRI.    Limitations Walking;Other (comment)   running, playing soccer   How long can you sit comfortably? indeterminate amount of time    How long can you stand comfortably? indeterminate amount of time    How long can you walk comfortably? with ankle brace, able to walk for work.    Diagnostic tests Xray shows possible small avulsion fracture medial talus    Patient Stated Goals to return to active lifestyle    Currently in Pain? 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     Cognition   Overall Cognitive Status Within Functional Limits for tasks assessed      Observation/Other Assessments   Focus on Therapeutic Outcomes (FOTO)  55% limited      Observation/Other Assessments-Edema    Edema --   minimal edema noted at calcaneus     Sensation   Light Touch Appears Intact      Coordination   Gross Motor Movements are Fluid and Coordinated Yes      Functional Tests   Functional tests Squat;Single Leg Squat;Single leg stance;Sit to Stand;Other      Squat   Comments unable to fully squat with limited hip/ankle AROM  L LE; asymmetrical squat noted with decreased mobility L LE evident with reports of L ankle discomfort      Single Leg Squat   Comments unable on L LE due to L ankle pain; can perform on R LE      Single Leg Stance   Comments R > 10 sec with stability noted in R LE prior to PT stopping patient; 4 sec on L with reports of L ankle pain with PT noting + Trendelenburg with L SLS; with L SLS, toes clench the floor with increased supination L      Sit to Stand   Comments able symmetrically      Other:   Other/ Comments toe walking with decreased heel elevation on left; heel walking with incrased L calcaneal eversion noted and increased L hip ER; tandem gait painful x 11 feet x 2 bouts      Posture/Postural Control   Posture Comments able to stand symmetrically statically. + Trendelenburg noted with L SLS      ROM / Strength   AROM / PROM / Strength AROM;Strength      AROM   Overall AROM Comments L ankle  DF 5, R 12 degrees. L ankle forefoot inversion 5, eversion 10 degrees. R forefoot inversion 20 and eversion 5 degrees. L calcaneus rests in 5 degrees inversion. R calcaneus rests in 3 degrees inversion. L inversion AROM calcaneus 1 degrees. L eversion calcaneal eversion to neutral only      Strength   Overall Strength Comments pt can perform 10 calf raises bil but unable L only, inversion/eversion 3+/5      Palpation   Palpation comment pt w/increased tightness R calf, L metatarsal mob assessed good, L great toe mobility equal, R Hamstrings tighter      Special Tests   Other special tests PA/AP assessment without pain talocrural joint L      Ambulation/Gait   Gait Comments L hip IR, L foot supinates with gait and squat, L calcaneal eversion with gait.                      Objective measurements completed on examination: See above findings.               PT Education - 12/26/20 1254    Education Details reviewed assessment findings. discussed importance of stretching. discussed wear pattern of shoe with R shoe demonstrating increased supination. HEP: Access Code: [facility]  URL: [email or web address]  Date: 12/26/2020  Prepared by: Thornell Sartorius    Exercises  Gastroc Stretch on Wall - 2 x daily - 7 x weekly - 2 sets - 2 reps - 30 sec hold  Seated Marble Pick-Up with Toes - 1 x daily - 7 x weekly - 1 sets - 1 reps - 2 min to perform hold  Clamshell - 1 x daily - 3 x weekly - 2 sets - 20 reps    Person(s) Educated Patient    Methods Explanation;Handout;Demonstration    Comprehension Verbalized understanding;Returned demonstration;Tactile cues required            PT Short Term Goals - 12/26/20 1614      PT SHORT TERM GOAL #1   Title Pt will be able to descend steps with </= 3/10 L ankle pain    Baseline unable without pain or hesitancy    Time 3    Period Weeks    Status New    Target Date 01/16/21  PT SHORT TERM GOAL #2   Title Pt will be  able to tandem gait without pain x 25 feet    Baseline unable without pain    Time 3    Period Weeks    Status New    Target Date 01/16/21      PT SHORT TERM GOAL #3   Title Pt will be able to perform symmetrical squat with </= 2/10 L ankle pain    Baseline unable symmetrically without pain    Time 3    Period Weeks    Status New    Target Date 01/16/21             PT Long Term Goals - 12/26/20 1607      PT LONG TERM GOAL #1   Title Pt will be independent with HEP in prep for discharge    Baseline issued at evaluation    Time 5    Period Weeks    Status New    Target Date 02/06/21      PT LONG TERM GOAL #2   Title Pt will demo improved L ankle AROM DF to 10 degrees and L calcaneal eversion to 4 degrees to assist with sports.    Baseline L ankle DF 5. L calcaneus rests in 5 degrees inversion. L eversion calcaneal eversion to neutral only    Time 5    Period Weeks    Status New    Target Date 02/06/21      PT LONG TERM GOAL #3   Title Pt will be able to perform L single leg squat with good control x 10 to assist with improved strength for sports    Baseline unable    Time 5    Period Weeks    Status New    Target Date 02/06/21      PT LONG TERM GOAL #4   Title Pt will be able to perform 20 volleytip jumps without pain to assist with ageility movements    Baseline unable    Time 5    Period Weeks    Status New    Target Date 02/06/21      PT LONG TERM GOAL #5   Title Pt will be able to run x 5 minutes without L ankle pain    Baseline unable    Time 5    Period Weeks    Status New    Target Date 02/06/21                  Plan - 12/26/20 1256    Clinical Impression Statement Pt presents to PT with potential L small avulsion fracture medial talus per Xray. Pt demonstrates  abnormal gait, difficulty with L LE SLS, and functional L LE weakness. Pt with weakness along Glute Medius. Pt wearing small ankle ASO L ankle and hesitant to go without. Pt would  benefit from PT for L ankle stability/strengthening exercises, L glute med strengthening and flexibilty, possible orthotic consult due to L foot supination, and modalities and manual therapy as needed.    Examination-Activity Limitations Squat;Stairs;Locomotion Level;Stand;Other   running, soccer   Examination-Participation Restrictions Community Activity;Occupation    Stability/Clinical Decision Making Stable/Uncomplicated    Clinical Decision Making Low    Rehab Potential Excellent    PT Frequency 2x / week    PT Duration --   5 weeks   PT Treatment/Interventions Cryotherapy;Ultrasound;Electrical Stimulation;Gait training;Stair training;Functional mobility training;Neuromuscular re-education;Balance training;Therapeutic exercise;Therapeutic activities;Patient/family education;Manual techniques;Passive range of  motion;Dry needling;Taping    PT Next Visit Plan review HEP, work on SLS activities L LE, L glute med strengthening, perform hip flexor flexibility therex    PT Home Exercise Plan Access Code: [place]  URL: [email or web address]  Date: 12/26/2020  Prepared by: Thornell Sartorius    Exercises  Gastroc Stretch on Wall - 2 x daily - 7 x weekly - 2 sets - 2 reps - 30 sec hold  Seated Marble Pick-Up with Toes - 1 x daily - 7 x weekly - 1 sets - 1 reps - 2 min to perform hold  Clamshell - 1 x daily - 3 x weekly - 2 sets - 20 reps    Consulted and Agree with Plan of Care Patient           Patient will benefit from skilled therapeutic intervention in order to improve the following deficits and impairments:  Abnormal gait,Decreased balance,Decreased endurance,Decreased mobility,Difficulty walking,Hypomobility,Increased edema,Decreased range of motion,Decreased activity tolerance,Decreased strength,Impaired flexibility,Pain  Visit Diagnosis: Pain in left ankle and joints of left foot  Muscle weakness (generalized)  Other lack of coordination     Problem List Patient Active Problem  List   Diagnosis Date Noted  . Vitamin D deficiency 07/07/2015  . Idiopathic thrombocytopenia ([facility]) 07/07/2015    Luna Fuse, PT, DPT 12/26/2020, 4:31 PM  [facility] [address] [place], [place], [address] Phone: [phone]   Fax:  [phone]  Name: JERED HEINY MRN: [id number] Date of Birth: 1981/03/28

## 2021-01-09 ENCOUNTER — Encounter: Payer: Self-pay | Admitting: Physical Therapy

## 2021-01-09 ENCOUNTER — Other Ambulatory Visit: Payer: Self-pay

## 2021-01-09 ENCOUNTER — Ambulatory Visit: Payer: No Typology Code available for payment source | Admitting: Physical Therapy

## 2021-01-09 DIAGNOSIS — M6281 Muscle weakness (generalized): Secondary | ICD-10-CM

## 2021-01-09 DIAGNOSIS — M25572 Pain in left ankle and joints of left foot: Secondary | ICD-10-CM | POA: Diagnosis not present

## 2021-01-09 DIAGNOSIS — R278 Other lack of coordination: Secondary | ICD-10-CM | POA: Diagnosis not present

## 2021-01-09 NOTE — Therapy (Signed)
[facility] Physical Therapy [address] [place], [place], [address] Phone: [phone]   Fax:  [phone]  Physical Therapy Treatment  Patient Details  Name: Louis Jennings MRN: [id number] Date of Birth: 03/23/1981 Referring Provider (PT): [name], MD   Encounter Date: 01/09/2021   PT End of Session - 01/09/21 1310    Visit Number 2    Number of Visits 10    Date for PT Re-Evaluation 02/06/21    Progress Note Due on Visit 10    PT Start Time 1300    PT Stop Time 1340    PT Time Calculation (min) 40 min    Activity Tolerance Patient tolerated treatment well;Patient limited by pain    Behavior During Therapy [facility] for tasks assessed/performed           Past Medical History:  Diagnosis Date  . Thrombocytopenia (HCC)   . Vitamin D deficiency     History reviewed. No pertinent surgical history.  There were no vitals filed for this visit.   Subjective Assessment - 01/09/21 1307    Subjective Pt arriving to therpay today reporting he decreased his ankle brace which seemed to cause more pain at lateral malloelus. Pt stated he took aleve to help with the pain.    Pertinent History 12/04/20 pt had inversion ankle injury playing soccer. Used a knee scooter at work along with trying splint, being NWBB, icing, compression and NSAIDS. Xray showed possible small avulsion fracture medial talus. Placed in a CAM boot and referred to PT. if no improvement, MD to consider MRI.    How long can you sit comfortably? indeterminate amount of time    How long can you stand comfortably? indeterminate amount of time    How long can you walk comfortably? with ankle brace, able to walk for work.    Diagnostic tests Xray shows possible small avulsion fracture medial talus    Currently in Pain? No/denies                             [facility] Adult PT Treatment/Exercise - 01/09/21 0001      Exercises   Exercises Ankle      Ankle Exercises: Stretches   Soleus  Stretch 3 reps;30 seconds    Gastroc Stretch 3 reps;30 seconds      Ankle Exercises: Standing   Rocker Board 2 minutes    Heel Raises Both;3 seconds;15 reps    Toe Raise 15 reps;2 seconds    Balance Beam Airex x 6 reps    Other Standing Ankle Exercises Airex: SLS 30 second holds x 3 with intermittent UE support, hip abduction 2x15 intermittet UE support    Other Standing Ankle Exercises Tandum Stance with intermittent UE support, step [place] x 6 inch step x 15      Ankle Exercises: Seated   Towel Crunch 3 reps    BAPS Level 1;Sitting;Limitations    BAPS Limitations 2 minutes each direction    Other Seated Ankle Exercises 4 way ankle exercises using red theraband                    PT Short Term Goals - 01/09/21 1347      PT SHORT TERM GOAL #1   Title Pt will be able to descend steps with </= 3/10 L ankle pain    Status On-going      PT SHORT TERM GOAL #2   Title Pt  will be able to tandem gait without pain x 25 feet    Status On-going      PT SHORT TERM GOAL #3   Title Pt will be able to perform symmetrical squat with </= 2/10 L ankle pain    Status On-going             PT Long Term Goals - 12/26/20 1607      PT LONG TERM GOAL #1   Title Pt will be independent with HEP in prep for discharge    Baseline issued at evaluation    Time 5    Period Weeks    Status New    Target Date 02/06/21      PT LONG TERM GOAL #2   Title Pt will demo improved L ankle AROM DF to 10 degrees and L calcaneal eversion to 4 degrees to assist with sports.    Baseline L ankle DF 5. L calcaneus rests in 5 degrees inversion. L eversion calcaneal eversion to neutral only    Time 5    Period Weeks    Status New    Target Date 02/06/21      PT LONG TERM GOAL #3   Title Pt will be able to perform L single leg squat with good control x 10 to assist with improved strength for sports    Baseline unable    Time 5    Period Weeks    Status New    Target Date 02/06/21      PT LONG TERM  GOAL #4   Title Pt will be able to perform 20 volleytip jumps without pain to assist with ageility movements    Baseline unable    Time 5    Period Weeks    Status New    Target Date 02/06/21      PT LONG TERM GOAL #5   Title Pt will be able to run x 5 minutes without L ankle pain    Baseline unable    Time 5    Period Weeks    Status New    Target Date 02/06/21                 Plan - 01/09/21 1311    Clinical Impression Statement Pt arriving to therapy reporting no pain. Pt did however report stiffness and pain first thing this morning and afer he attempted to kick a soccer ball to his son while planting with his left foot. Pt tolerating the exercise well and reporting compliance with HEP. Focusing on ankle strengthening and single leg stance and guteal medius strengthening. Continue skilled PT.    Examination-Activity Limitations Squat;Stairs;Locomotion Level;Stand;Other    Stability/Clinical Decision Making Stable/Uncomplicated    Rehab Potential Excellent    PT Frequency 2x / week    PT Treatment/Interventions Cryotherapy;Ultrasound;Electrical Stimulation;Gait training;Stair training;Functional mobility training;Neuromuscular re-education;Balance training;Therapeutic exercise;Therapeutic activities;Patient/family education;Manual techniques;Passive range of motion;Dry needling;Taping    PT Next Visit Plan work on SLS activities L LE, L glute med strengthening, perform hip flexor flexibility therex    PT Home Exercise Plan Access Code: [facility]  URL: [email or web address]  Date: 12/26/2020  Prepared by: Thornell Sartorius    Exercises  Gastroc Stretch on Wall - 2 x daily - 7 x weekly - 2 sets - 2 reps - 30 sec hold  Seated Marble Pick-Up with Toes - 1 x daily - 7 x weekly - 1 sets - 1 reps - 2 min to perform hold  Clamshell -  1 x daily - 3 x weekly - 2 sets - 20 reps    Consulted and Agree with Plan of Care Patient           Patient will benefit from skilled  therapeutic intervention in order to improve the following deficits and impairments:  Abnormal gait,Decreased balance,Decreased endurance,Decreased mobility,Difficulty walking,Hypomobility,Increased edema,Decreased range of motion,Decreased activity tolerance,Decreased strength,Impaired flexibility,Pain  Visit Diagnosis: Pain in left ankle and joints of left foot  Muscle weakness (generalized)  Other lack of coordination     Problem List Patient Active Problem List   Diagnosis Date Noted  . Vitamin D deficiency 07/07/2015  . Idiopathic thrombocytopenia (HCC) 07/07/2015    Sharmon Leyden, PT, MPT 01/09/2021, 1:49 PM  [facility] Physical Therapy [address] [place], [place], [address] Phone: [phone]   Fax:  [phone]  Name: Louis Jennings MRN: [id number] Date of Birth: [birth year]

## 2021-01-11 ENCOUNTER — Encounter: Payer: Self-pay | Admitting: Physical Therapy

## 2021-01-11 ENCOUNTER — Ambulatory Visit: Payer: No Typology Code available for payment source | Admitting: Physical Therapy

## 2021-01-11 ENCOUNTER — Other Ambulatory Visit: Payer: Self-pay

## 2021-01-11 DIAGNOSIS — M25572 Pain in left ankle and joints of left foot: Secondary | ICD-10-CM

## 2021-01-11 DIAGNOSIS — M6281 Muscle weakness (generalized): Secondary | ICD-10-CM

## 2021-01-11 DIAGNOSIS — R278 Other lack of coordination: Secondary | ICD-10-CM

## 2021-01-11 NOTE — Therapy (Signed)
[facility] Physical Therapy [address] [place], [place], [address] Phone: [phone]   Fax:  [phone]  Physical Therapy Treatment  Patient Details  Name: Louis Jennings MRN: [id number] Date of Birth: October 31, 1980 Referring Provider (PT): [name], MD   Encounter Date: 01/11/2021   PT End of Session - 01/11/21 1426    Visit Number 3    Number of Visits 10    Date for PT Re-Evaluation 02/06/21    Progress Note Due on Visit 10    PT Start Time 1355   pt arrived late   PT Stop Time 1425    PT Time Calculation (min) 30 min    Activity Tolerance Patient tolerated treatment well;Patient limited by pain    Behavior During Therapy [facility] for tasks assessed/performed           Past Medical History:  Diagnosis Date  . Thrombocytopenia (HCC)   . Vitamin D deficiency     History reviewed. No pertinent surgical history.  There were no vitals filed for this visit.   Subjective Assessment - 01/11/21 1356    Subjective ankle is better today; not hurting today    Pertinent History 12/04/20 pt had inversion ankle injury playing soccer. Used a knee scooter at work along with trying splint, being NWBB, icing, compression and NSAIDS. Xray showed possible small avulsion fracture medial talus. Placed in a CAM boot and referred to PT. if no improvement, MD to consider MRI.    How long can you sit comfortably? indeterminate amount of time    How long can you stand comfortably? indeterminate amount of time    How long can you walk comfortably? with ankle brace, able to walk for work.    Diagnostic tests Xray shows possible small avulsion fracture medial talus                             [facility] Adult PT Treatment/Exercise - 01/11/21 1357      Ankle Exercises: Standing   SLS on foam with RLE movement 2x10 reps; SL deadlift with 10# KB x 10 reps bil with intermittent UE support    Other Standing Ankle Exercises SL calf raise in 4 directions x 5-6 reps each  direction      Ankle Exercises: Stretches   Slant Board Stretch 3 reps;30 seconds   gastroc and soleus     Ankle Exercises: Aerobic   Tread Mill up to 6.5 mph x 3 min - no significant deviations noted or increased pain      Ankle Exercises: Plyometrics   Plyometric Exercises forward/backward and lateral hopping as well as quick feet forward/backward                    PT Short Term Goals - 01/09/21 1347      PT SHORT TERM GOAL #1   Title Pt will be able to descend steps with </= 3/10 L ankle pain    Status On-going      PT SHORT TERM GOAL #2   Title Pt will be able to tandem gait without pain x 25 feet    Status On-going      PT SHORT TERM GOAL #3   Title Pt will be able to perform symmetrical squat with </= 2/10 L ankle pain    Status On-going             PT Long Term Goals - 12/26/20 1607  PT LONG TERM GOAL #1   Title Pt will be independent with HEP in prep for discharge    Baseline issued at evaluation    Time 5    Period Weeks    Status New    Target Date 02/06/21      PT LONG TERM GOAL #2   Title Pt will demo improved L ankle AROM DF to 10 degrees and L calcaneal eversion to 4 degrees to assist with sports.    Baseline L ankle DF 5. L calcaneus rests in 5 degrees inversion. L eversion calcaneal eversion to neutral only    Time 5    Period Weeks    Status New    Target Date 02/06/21      PT LONG TERM GOAL #3   Title Pt will be able to perform L single leg squat with good control x 10 to assist with improved strength for sports    Baseline unable    Time 5    Period Weeks    Status New    Target Date 02/06/21      PT LONG TERM GOAL #4   Title Pt will be able to perform 20 volleytip jumps without pain to assist with ageility movements    Baseline unable    Time 5    Period Weeks    Status New    Target Date 02/06/21      PT LONG TERM GOAL #5   Title Pt will be able to run x 5 minutes without L ankle pain    Baseline unable    Time 5     Period Weeks    Status New    Target Date 02/06/21                 Plan - 01/11/21 1426    Clinical Impression Statement Pt tolerated session well today without pain, and trial of jogging without increase in pain or deviations noted.  Jogging at 6.5 mph is slower than average pace and discussed gradual return.  Will continue to benefit from PT to maximize function.    Examination-Activity Limitations Squat;Stairs;Locomotion Level;Stand;Other    Stability/Clinical Decision Making Stable/Uncomplicated    Rehab Potential Excellent    PT Frequency 2x / week    PT Treatment/Interventions Cryotherapy;Ultrasound;Electrical Stimulation;Gait training;Stair training;Functional mobility training;Neuromuscular re-education;Balance training;Therapeutic exercise;Therapeutic activities;Patient/family education;Manual techniques;Passive range of motion;Dry needling;Taping    PT Next Visit Plan work on SLS activities L LE, L glute med strengthening, perform hip flexor flexibility therex; treadmill jogging/dynamic activities    PT Home Exercise Plan Access Code: [email or web address]    Consulted and Agree with Plan of Care Patient           Patient will benefit from skilled therapeutic intervention in order to improve the following deficits and impairments:  Abnormal gait,Decreased balance,Decreased endurance,Decreased mobility,Difficulty walking,Hypomobility,Increased edema,Decreased range of motion,Decreased activity tolerance,Decreased strength,Impaired flexibility,Pain  Visit Diagnosis: Pain in left ankle and joints of left foot  Muscle weakness (generalized)  Other lack of coordination     Problem List Patient Active Problem List   Diagnosis Date Noted  . Vitamin D deficiency 07/07/2015  . Idiopathic thrombocytopenia (HCC) 07/07/2015      Clarita Crane, PT, DPT 01/11/21 2:29 PM    [facility] Physical Therapy [address] [place], [place], [address] Phone:  [phone]   Fax:  [phone]  Name: Louis Jennings MRN: [id number] Date of Birth: 01/22/1981

## 2021-01-16 ENCOUNTER — Ambulatory Visit (INDEPENDENT_AMBULATORY_CARE_PROVIDER_SITE_OTHER): Payer: No Typology Code available for payment source | Admitting: Physical Therapy

## 2021-01-16 ENCOUNTER — Other Ambulatory Visit: Payer: Self-pay

## 2021-01-16 ENCOUNTER — Encounter: Payer: Self-pay | Admitting: Physical Therapy

## 2021-01-16 DIAGNOSIS — R278 Other lack of coordination: Secondary | ICD-10-CM | POA: Diagnosis not present

## 2021-01-16 DIAGNOSIS — M25572 Pain in left ankle and joints of left foot: Secondary | ICD-10-CM | POA: Diagnosis not present

## 2021-01-16 DIAGNOSIS — M6281 Muscle weakness (generalized): Secondary | ICD-10-CM

## 2021-01-16 NOTE — Therapy (Signed)
[facility] Physical Therapy [address] [place], [place], [address] Phone: [phone]   Fax:  [phone]  Physical Therapy Treatment  Patient Details  Name: Louis Jennings MRN: [id number] Date of Birth: 1981/08/21 Referring Provider (PT): [name], MD   Encounter Date: 01/16/2021   PT End of Session - 01/16/21 1424    Visit Number 4    Number of Visits 10    Date for PT Re-Evaluation 02/06/21    Progress Note Due on Visit 10    PT Start Time 1345    PT Stop Time 1423    PT Time Calculation (min) 38 min    Activity Tolerance Patient tolerated treatment well;Patient limited by pain    Behavior During Therapy [facility] for tasks assessed/performed           Past Medical History:  Diagnosis Date  . Thrombocytopenia (HCC)   . Vitamin D deficiency     History reviewed. No pertinent surgical history.  There were no vitals filed for this visit.   Subjective Assessment - 01/16/21 1423    Subjective Pt reporting ankle is doing better, but still reproting pain/soreness under medial malleoleus.    Pertinent History 12/04/20 pt had inversion ankle injury playing soccer. Used a knee scooter at work along with trying splint, being NWBB, icing, compression and NSAIDS. Xray showed possible small avulsion fracture medial talus. Placed in a CAM boot and referred to PT. if no improvement, MD to consider MRI.    How long can you sit comfortably? indeterminate amount of time    How long can you stand comfortably? indeterminate amount of time    How long can you walk comfortably? with ankle brace, able to walk for work.    Diagnostic tests Xray shows possible small avulsion fracture medial talus    Patient Stated Goals to return to active lifestyle    Currently in Pain? Yes    Pain Score 1     Pain Orientation Left    Pain Descriptors / Indicators Sore    Pain Onset More than a month ago    Pain Frequency Intermittent                              [facility] Adult PT Treatment/Exercise - 01/16/21 0001      Manual Therapy   Manual therapy comments DF/achilles stretch L ankle, subtalar grade 2-3 mobs   5 minutes     Ankle Exercises: Stretches   Slant Board Stretch 3 reps;30 seconds   gastroc and soleus     Ankle Exercises: Aerobic   Tread Mill 3.5 mph x 2 minutes, 6.0 mph x 5 minutes      Ankle Exercises: Plyometrics   Plyometric Exercises side to side in vectors/, jumpting on 4 inch step x 10    Plyometric Exercises kicking soccer ball x 10 each LE      Ankle Exercises: Standing   Other Standing Ankle Exercises SLS on BOSU ball                    PT Short Term Goals - 01/16/21 1427      PT SHORT TERM GOAL #1   Title Pt will be able to descend steps with </= 3/10 L ankle pain    Status On-going      PT SHORT TERM GOAL #2   Title Pt will be able to tandem gait without pain x 25  feet    Status On-going      PT SHORT TERM GOAL #3   Title Pt will be able to perform symmetrical squat with </= 2/10 L ankle pain    Status On-going             PT Long Term Goals - 01/16/21 1427      PT LONG TERM GOAL #1   Title Pt will be independent with HEP in prep for discharge    Status On-going      PT LONG TERM GOAL #2   Title Pt will demo improved L ankle AROM DF to 10 degrees and L calcaneal eversion to 4 degrees to assist with sports.    Baseline L ankle DF 5. L calcaneus rests in 5 degrees inversion. L eversion calcaneal eversion to neutral only    Status On-going      PT LONG TERM GOAL #3   Title Pt will be able to perform L single leg squat with good control x 10 to assist with improved strength for sports    Status On-going      PT LONG TERM GOAL #4   Title Pt will be able to perform 20 volleytip jumps without pain to assist with ageility movements    Status On-going      PT LONG TERM GOAL #5   Title Pt will be able to run x 5 minutes without L ankle pain    Baseline jogging at 6.0 -6.77mph with mild soreness  reported under medial malleolus    Status On-going                 Plan - 01/16/21 1425    Clinical Impression Statement Pt tolerating exercises well today progressing with plyometrics and SLS activities. Pt tolerating jogging on TM at 6.42mph for 5 minutes with 2 minute pacing up and down. Continue skilled PT to maximize funtional mobility.    Examination-Activity Limitations Squat;Stairs;Locomotion Level;Stand;Other    Stability/Clinical Decision Making Stable/Uncomplicated    Rehab Potential Excellent    PT Frequency 2x / week    PT Treatment/Interventions Cryotherapy;Ultrasound;Electrical Stimulation;Gait training;Stair training;Functional mobility training;Neuromuscular re-education;Balance training;Therapeutic exercise;Therapeutic activities;Patient/family education;Manual techniques;Passive range of motion;Dry needling;Taping    PT Next Visit Plan work on SLS activities L LE, L glute med strengthening, perform hip flexor flexibility therex; treadmill jogging/dynamic activities, plyometrics    PT Home Exercise Plan Access Code: [email or web address]    Consulted and Agree with Plan of Care Patient           Patient will benefit from skilled therapeutic intervention in order to improve the following deficits and impairments:  Abnormal gait,Decreased balance,Decreased endurance,Decreased mobility,Difficulty walking,Hypomobility,Increased edema,Decreased range of motion,Decreased activity tolerance,Decreased strength,Impaired flexibility,Pain  Visit Diagnosis: Pain in left ankle and joints of left foot  Muscle weakness (generalized)  Other lack of coordination     Problem List Patient Active Problem List   Diagnosis Date Noted  . Vitamin D deficiency 07/07/2015  . Idiopathic thrombocytopenia (HCC) 07/07/2015    Sharmon Leyden, PT, MPT 01/16/2021, 2:30 PM  [facility] Physical Therapy [address] [place], [place], [address] Phone: [phone]   Fax:   [phone]  Name: Louis Jennings MRN: [id number] Date of Birth: 1981/03/19

## 2021-01-18 ENCOUNTER — Encounter: Payer: No Typology Code available for payment source | Admitting: Rehabilitative and Restorative Service Providers"

## 2021-01-23 ENCOUNTER — Other Ambulatory Visit: Payer: Self-pay

## 2021-01-23 ENCOUNTER — Ambulatory Visit: Payer: No Typology Code available for payment source | Admitting: Physical Therapy

## 2021-01-23 ENCOUNTER — Encounter: Payer: Self-pay | Admitting: Physical Therapy

## 2021-01-23 DIAGNOSIS — M25572 Pain in left ankle and joints of left foot: Secondary | ICD-10-CM

## 2021-01-23 DIAGNOSIS — M6281 Muscle weakness (generalized): Secondary | ICD-10-CM

## 2021-01-23 DIAGNOSIS — R278 Other lack of coordination: Secondary | ICD-10-CM

## 2021-01-23 NOTE — Therapy (Signed)
[facility] Physical Therapy [address] [place], [place], [address] Phone: [phone]   Fax:  [phone]  Physical Therapy Treatment  Patient Details  Name: Louis Jennings MRN: [id number] Date of Birth: 1980-09-18 Referring Provider (PT): [name], MD   Encounter Date: 01/23/2021   PT End of Session - 01/23/21 1405    Visit Number 5    Number of Visits 10    Date for PT Re-Evaluation 02/06/21    Progress Note Due on Visit 10    PT Start Time 1345    PT Stop Time 1425    PT Time Calculation (min) 40 min    Activity Tolerance Patient tolerated treatment well;Patient limited by pain    Behavior During Therapy [facility] for tasks assessed/performed           Past Medical History:  Diagnosis Date  . Thrombocytopenia (HCC)   . Vitamin D deficiency     History reviewed. No pertinent surgical history.  There were no vitals filed for this visit.   Subjective Assessment - 01/23/21 1349    Subjective Pt reporting running 3 miles on Sunday and he tried again running yesterday but after 1.5 miles he started having pain along anterior joint line. Pain is usually around medial malleolus.    Pertinent History 12/04/20 pt had inversion ankle injury playing soccer. Used a knee scooter at work along with trying splint, being NWBB, icing, compression and NSAIDS. Xray showed possible small avulsion fracture medial talus. Placed in a CAM boot and referred to PT. if no improvement, MD to consider MRI.    How long can you sit comfortably? indeterminate amount of time    How long can you stand comfortably? indeterminate amount of time    How long can you walk comfortably? with ankle brace, able to walk for work.    Diagnostic tests Xray shows possible small avulsion fracture medial talus    Currently in Pain? Yes    Pain Score 3     Pain Location Ankle    Pain Orientation Left    Pain Descriptors / Indicators Burning    Pain Type Acute pain    Pain Onset More than a month  ago    Aggravating Factors  running                             [facility] Adult PT Treatment/Exercise - 01/23/21 0001      Manual Therapy   Manual therapy comments DF/achilles stretch L ankle, subtalar grade 2-3 mobs   5 minutes     Ankle Exercises: Standing   Other Standing Ankle Exercises SLS on BOSU ball dome up 45 seconds with UE balance reactions to maintain balance    Other Standing Ankle Exercises braiding front/back, side stepping, jumpting over 4 inch step, straddle jumping 4 inch step      Ankle Exercises: Stretches   Slant Board Stretch 2 reps;30 seconds      Ankle Exercises: Aerobic   Tread Mill 2.5 x 2 minutes, progressing to 6. x 5 minutess, 7.0 mph x 2 minutes, gradual slow down over 2 minutes                    PT Short Term Goals - 01/23/21 1440      PT SHORT TERM GOAL #1   Title Pt will be able to descend steps with </= 3/10 L ankle pain    Status On-going  PT SHORT TERM GOAL #2   Title Pt will be able to tandem gait without pain x 25 feet    Status On-going      PT SHORT TERM GOAL #3   Title Pt will be able to perform symmetrical squat with </= 2/10 L ankle pain    Status On-going             PT Long Term Goals - 01/16/21 1427      PT LONG TERM GOAL #1   Title Pt will be independent with HEP in prep for discharge    Status On-going      PT LONG TERM GOAL #2   Title Pt will demo improved L ankle AROM DF to 10 degrees and L calcaneal eversion to 4 degrees to assist with sports.    Baseline L ankle DF 5. L calcaneus rests in 5 degrees inversion. L eversion calcaneal eversion to neutral only    Status On-going      PT LONG TERM GOAL #3   Title Pt will be able to perform L single leg squat with good control x 10 to assist with improved strength for sports    Status On-going      PT LONG TERM GOAL #4   Title Pt will be able to perform 20 volleytip jumps without pain to assist with ageility movements    Status  On-going      PT LONG TERM GOAL #5   Title Pt will be able to run x 5 minutes without L ankle pain    Baseline jogging at 6.0 -6.86mph with mild soreness reported under medial malleolus    Status On-going                 Plan - 01/23/21 1406    Clinical Impression Statement Pt arriving reporting no pain. Pt reporting no pain with treadmill today working up to 7.0 mph. Pt progresing with L ankle stability and dynamic activities. Still reporting soreness under medial malleolus with inversion and eversion activities.  Continue skilled PT to maximize function.    Examination-Activity Limitations Squat;Stairs;Locomotion Level;Stand;Other    Examination-Participation Restrictions Community Activity;Occupation    Stability/Clinical Decision Making Stable/Uncomplicated    Rehab Potential Excellent    PT Frequency 2x / week    PT Treatment/Interventions Cryotherapy;Ultrasound;Electrical Stimulation;Gait training;Stair training;Functional mobility training;Neuromuscular re-education;Balance training;Therapeutic exercise;Therapeutic activities;Patient/family education;Manual techniques;Passive range of motion;Dry needling;Taping    PT Next Visit Plan work on SLS activities L LE, L glute med strengthening, perform hip flexor flexibility therex; treadmill jogging/dynamic activities, plyometrics    PT Home Exercise Plan Access Code: [email or web address]    Consulted and Agree with Plan of Care Patient           Patient will benefit from skilled therapeutic intervention in order to improve the following deficits and impairments:  Abnormal gait,Decreased balance,Decreased endurance,Decreased mobility,Difficulty walking,Hypomobility,Increased edema,Decreased range of motion,Decreased activity tolerance,Decreased strength,Impaired flexibility,Pain  Visit Diagnosis: Pain in left ankle and joints of left foot  Muscle weakness (generalized)  Other lack of coordination     Problem List Patient Active  Problem List   Diagnosis Date Noted  . Vitamin D deficiency 07/07/2015  . Idiopathic thrombocytopenia (HCC) 07/07/2015    Sharmon Leyden, PT, MPT 01/23/2021, 2:42 PM  [facility] Physical Therapy [address] [place], [place], [address] Phone: [phone]   Fax:  [phone]  Name: Louis Jennings MRN: [id number] Date of Birth: [birth year]

## 2021-01-25 ENCOUNTER — Ambulatory Visit: Payer: No Typology Code available for payment source | Admitting: Physical Therapy

## 2021-01-25 ENCOUNTER — Other Ambulatory Visit: Payer: Self-pay

## 2021-01-25 ENCOUNTER — Encounter: Payer: Self-pay | Admitting: Physical Therapy

## 2021-01-25 DIAGNOSIS — R278 Other lack of coordination: Secondary | ICD-10-CM

## 2021-01-25 DIAGNOSIS — M25572 Pain in left ankle and joints of left foot: Secondary | ICD-10-CM | POA: Diagnosis not present

## 2021-01-25 DIAGNOSIS — M6281 Muscle weakness (generalized): Secondary | ICD-10-CM

## 2021-01-25 NOTE — Therapy (Addendum)
[facility] Physical Therapy [address] [place], [place], [address] Phone: [phone]   Fax:  [phone]  Physical Therapy Treatment/Discharge  Patient Details  Name: Louis Jennings MRN: [id number] Date of Birth: 09-11-1980 Referring Provider (PT): Lynne Leader, MD   Encounter Date: 01/25/2021   PT End of Session - 01/25/21 1422     Visit Number 6    Number of Visits 10    Date for PT Re-Evaluation 02/06/21    Progress Note Due on Visit 10    PT Start Time 1343    PT Stop Time 1422    PT Time Calculation (min) 39 min    Activity Tolerance Patient tolerated treatment well;Patient limited by pain    Behavior During Therapy [facility] for tasks assessed/performed             Past Medical History:  Diagnosis Date   Thrombocytopenia ([place])    Vitamin D deficiency     History reviewed. No pertinent surgical history.  There were no vitals filed for this visit.   Subjective Assessment - 01/25/21 1345     Subjective ran some yesterday (about 2 miles) and ankle swelled up, back down today    Pertinent History 12/04/20 pt had inversion ankle injury playing soccer. Used a knee scooter at work along with trying splint, being NWBB, icing, compression and NSAIDS. Xray showed possible small avulsion fracture medial talus. Placed in a CAM boot and referred to PT. if no improvement, MD to consider MRI.    How long can you sit comfortably? indeterminate amount of time    How long can you stand comfortably? indeterminate amount of time    How long can you walk comfortably? with ankle brace, able to walk for work.    Diagnostic tests Xray shows possible small avulsion fracture medial talus    Currently in Pain? No/denies                               [facility] Adult PT Treatment/Exercise - 01/25/21 1348       Ankle Exercises: Aerobic   Tread Mill 5.5-8.5 x 8 min; 1 min cooldown      Ankle Exercises: Plyometrics   Bilateral Jumping 2 sets;10 reps    18.5"' 20"   Plyometric Exercises braiding; skipping - working on power      Ankle Exercises: Health and safety inspector 3 reps;30 seconds      Ankle Exercises: Standing   Other Standing Ankle Exercises SLS on BOSU ball dome with UE balance reactions to maintain balance; jumps onto BOSU bil LEs    Other Standing Ankle Exercises on foam LLE SL squat 3x10; SL DL LLE only with 12# KB 3x10                      PT Short Term Goals - 01/23/21 1440       PT SHORT TERM GOAL #1   Title Pt will be able to descend steps with </= 3/10 L ankle pain    Status On-going      PT SHORT TERM GOAL #2   Title Pt will be able to tandem gait without pain x 25 feet    Status On-going      PT SHORT TERM GOAL #3   Title Pt will be able to perform symmetrical squat with </= 2/10 L ankle pain    Status On-going  PT Long Term Goals - 01/16/21 1427       PT LONG TERM GOAL #1   Title Pt will be independent with HEP in prep for discharge    Status On-going      PT LONG TERM GOAL #2   Title Pt will demo improved L ankle AROM DF to 10 degrees and L calcaneal eversion to 4 degrees to assist with sports.    Baseline L ankle DF 5. L calcaneus rests in 5 degrees inversion. L eversion calcaneal eversion to neutral only    Status On-going      PT LONG TERM GOAL #3   Title Pt will be able to perform L single leg squat with good control x 10 to assist with improved strength for sports    Status On-going      PT LONG TERM GOAL #4   Title Pt will be able to perform 20 volleytip jumps without pain to assist with ageility movements    Status On-going      PT LONG TERM GOAL #5   Title Pt will be able to run x 5 minutes without L ankle pain    Baseline jogging at 6.0 -6.55mh with mild soreness reported under medial malleolus    Status On-going                   Plan - 01/25/21 1423     Clinical Impression Statement Pt tolerated session well today with mild pain  along posterior tibialis tendon which improves with activity.  Overall feel he is nearing d/c and pt in agreement with POC.  Will plan to check goals next visit and discuss d/c.    Examination-Activity Limitations Squat;Stairs;Locomotion Level;Stand;Other    Examination-Participation Restrictions Community Activity;Occupation    Stability/Clinical Decision Making Stable/Uncomplicated    Rehab Potential Excellent    PT Frequency 2x / week    PT Treatment/Interventions Cryotherapy;Ultrasound;Electrical Stimulation;Gait training;Stair training;Functional mobility training;Neuromuscular re-education;Balance training;Therapeutic exercise;Therapeutic activities;Patient/family education;Manual techniques;Passive range of motion;Dry needling;Taping    PT Next Visit Plan check goals, possible d/c    PT Home Exercise Plan Access Code: [facility]   Consulted and Agree with Plan of Care Patient             Patient will benefit from skilled therapeutic intervention in order to improve the following deficits and impairments:  Abnormal gait,Decreased balance,Decreased endurance,Decreased mobility,Difficulty walking,Hypomobility,Increased edema,Decreased range of motion,Decreased activity tolerance,Decreased strength,Impaired flexibility,Pain  Visit Diagnosis: Pain in left ankle and joints of left foot  Muscle weakness (generalized)  Other lack of coordination     Problem List Patient Active Problem List   Diagnosis Date Noted   Vitamin D deficiency 07/07/2015   Idiopathic thrombocytopenia ([place] 07/07/2015      SLaureen Abrahams PT, DPT 01/25/21 2:25 PM  PHYSICAL THERAPY DISCHARGE SUMMARY  Visits from Start of Care: 6  Current functional level related to goals / functional outcomes: See note   Remaining deficits: See note   Education / Equipment: HEP   Patient agrees to discharge. Patient goals were partially met. Patient is being discharged due to not returning since the  last visit.  MScot Jun PT, DPT, OCS, ATC 02/20/21  3:56 PM     [facility] [address] [address]Phone: [phone]  Fax:  [phone] Name: Louis CUSHMRN: [id number] of Birth: 801-03-1981

## 2021-01-30 ENCOUNTER — Encounter: Payer: No Typology Code available for payment source | Admitting: Physical Therapy

## 2021-02-01 ENCOUNTER — Encounter: Payer: No Typology Code available for payment source | Admitting: Physical Therapy

## 2021-02-06 ENCOUNTER — Encounter: Payer: No Typology Code available for payment source | Admitting: Physical Therapy

## 2021-02-08 ENCOUNTER — Encounter: Payer: No Typology Code available for payment source | Admitting: Physical Therapy

## 2021-04-10 ENCOUNTER — Encounter: Payer: Self-pay | Admitting: Family Medicine

## 2021-04-12 ENCOUNTER — Other Ambulatory Visit (HOSPITAL_COMMUNITY): Payer: Self-pay

## 2021-04-12 MED ORDER — DOXYCYCLINE HYCLATE 100 MG PO TABS
100.0000 mg | ORAL_TABLET | Freq: Every day | ORAL | 0 refills | Status: DC
  Filled 2021-04-12 – 2021-04-24 (×2): qty 60, 60d supply, fill #0

## 2021-04-20 ENCOUNTER — Other Ambulatory Visit (HOSPITAL_COMMUNITY): Payer: Self-pay

## 2021-04-24 ENCOUNTER — Other Ambulatory Visit (HOSPITAL_COMMUNITY): Payer: Self-pay

## 2021-07-02 ENCOUNTER — Encounter: Payer: No Typology Code available for payment source | Admitting: Family Medicine

## 2021-08-08 ENCOUNTER — Encounter: Payer: No Typology Code available for payment source | Admitting: Family Medicine

## 2021-09-04 IMAGING — DX DG ANKLE COMPLETE 3+V*L*
3 series · 3 of 3 positions shown · non-contrast
Comparison: Left ankle radiograph 12/05/2020

CLINICAL DATA: Left ankle pain for 9 days. Inversion injury while
playing soccer.

EXAM:
LEFT ANKLE COMPLETE - 3+ VIEW

[ankle ap]
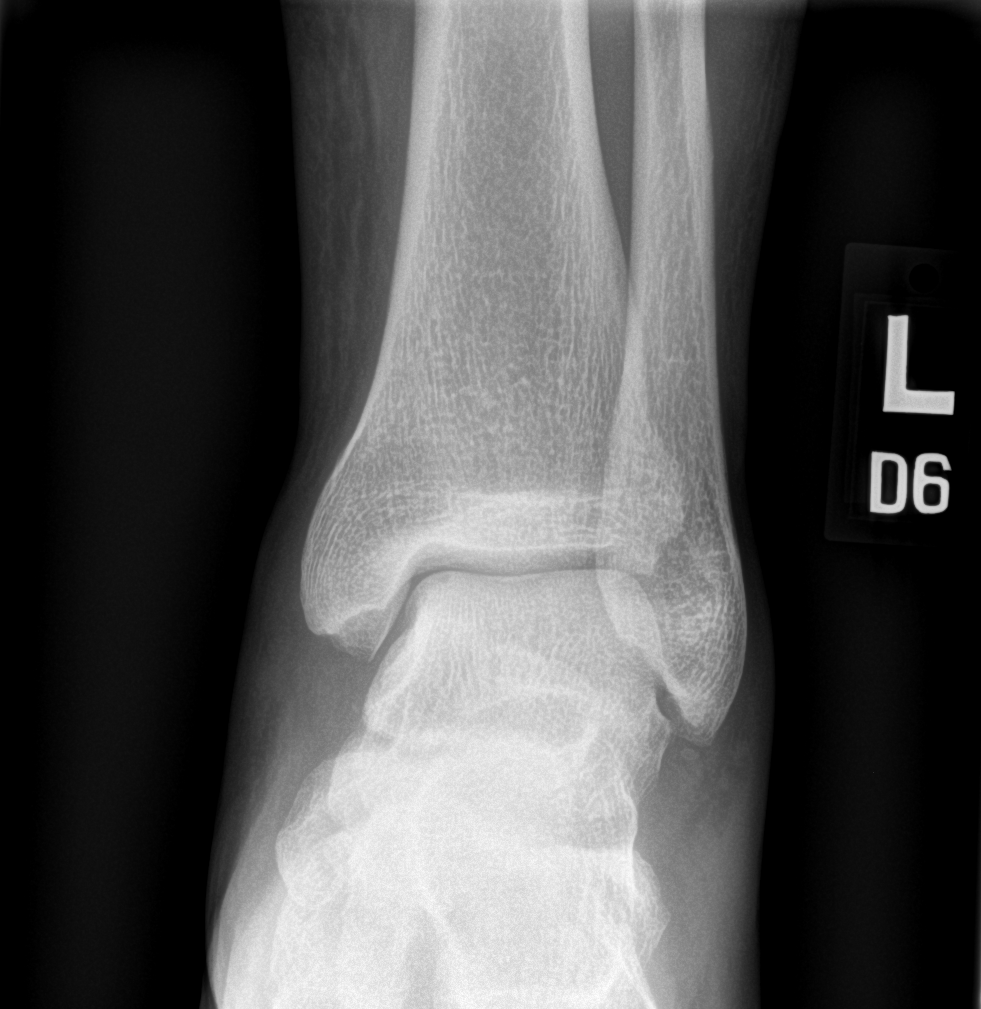

[ankle obl]
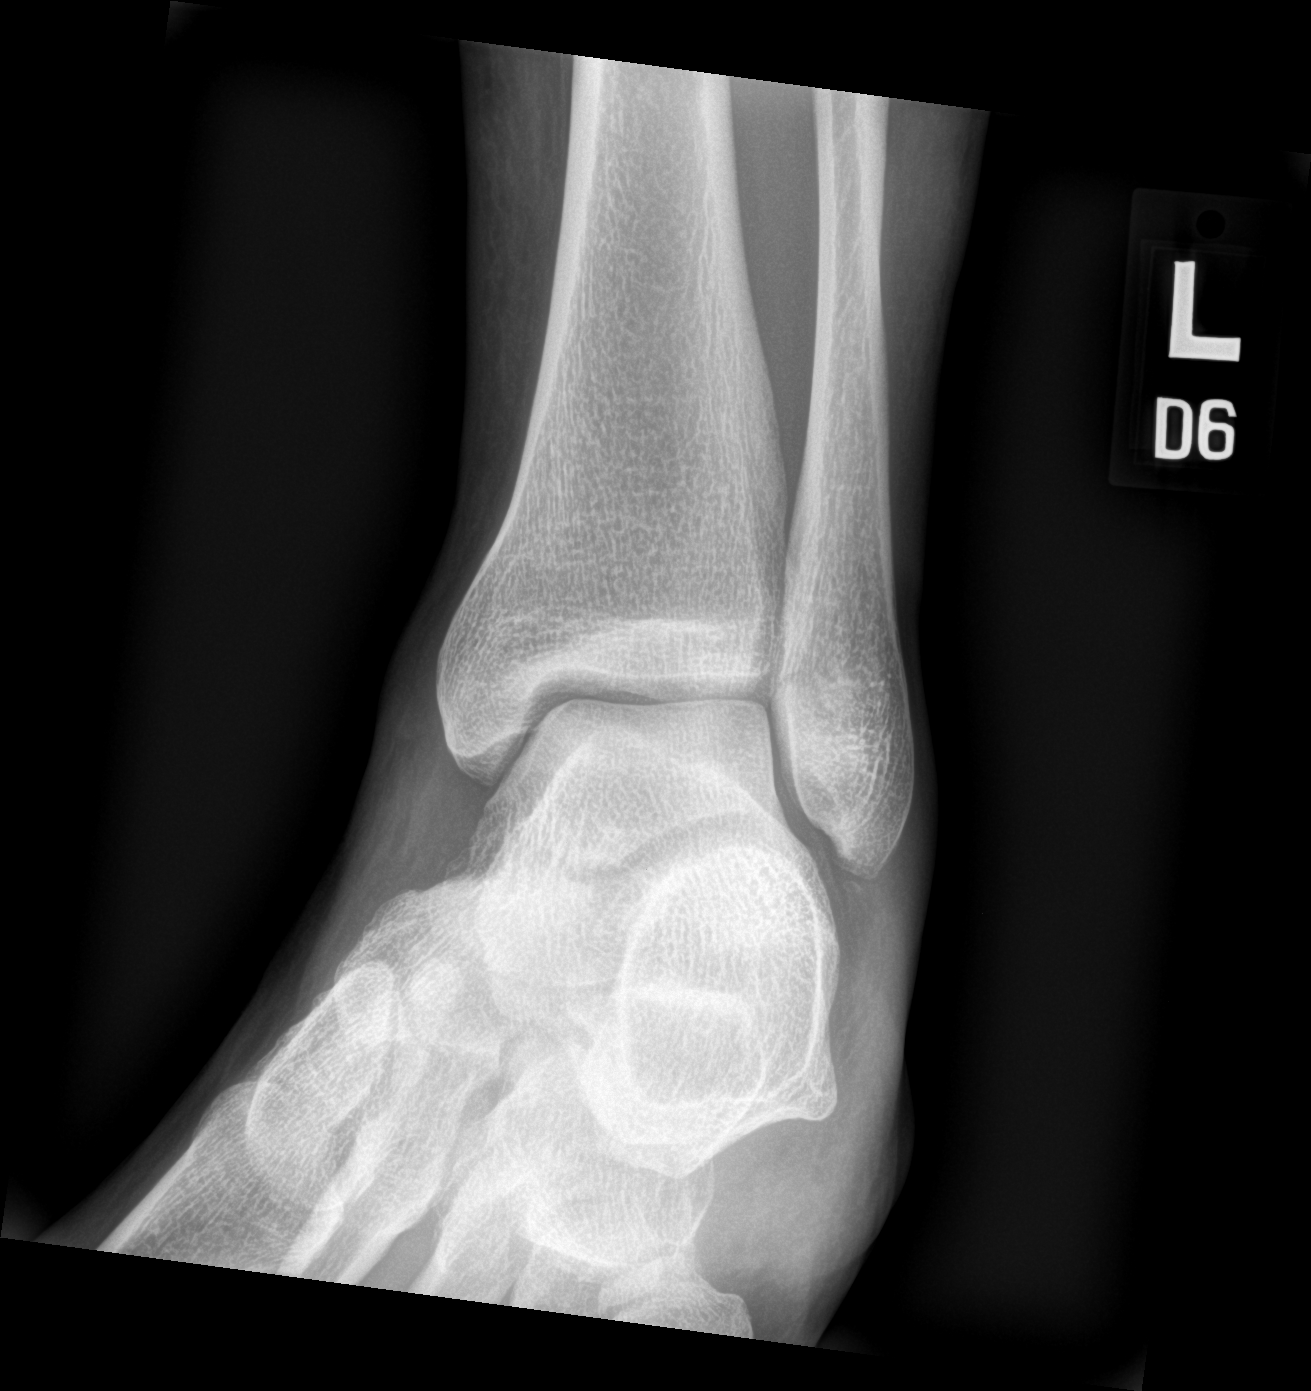

[ankle lat]
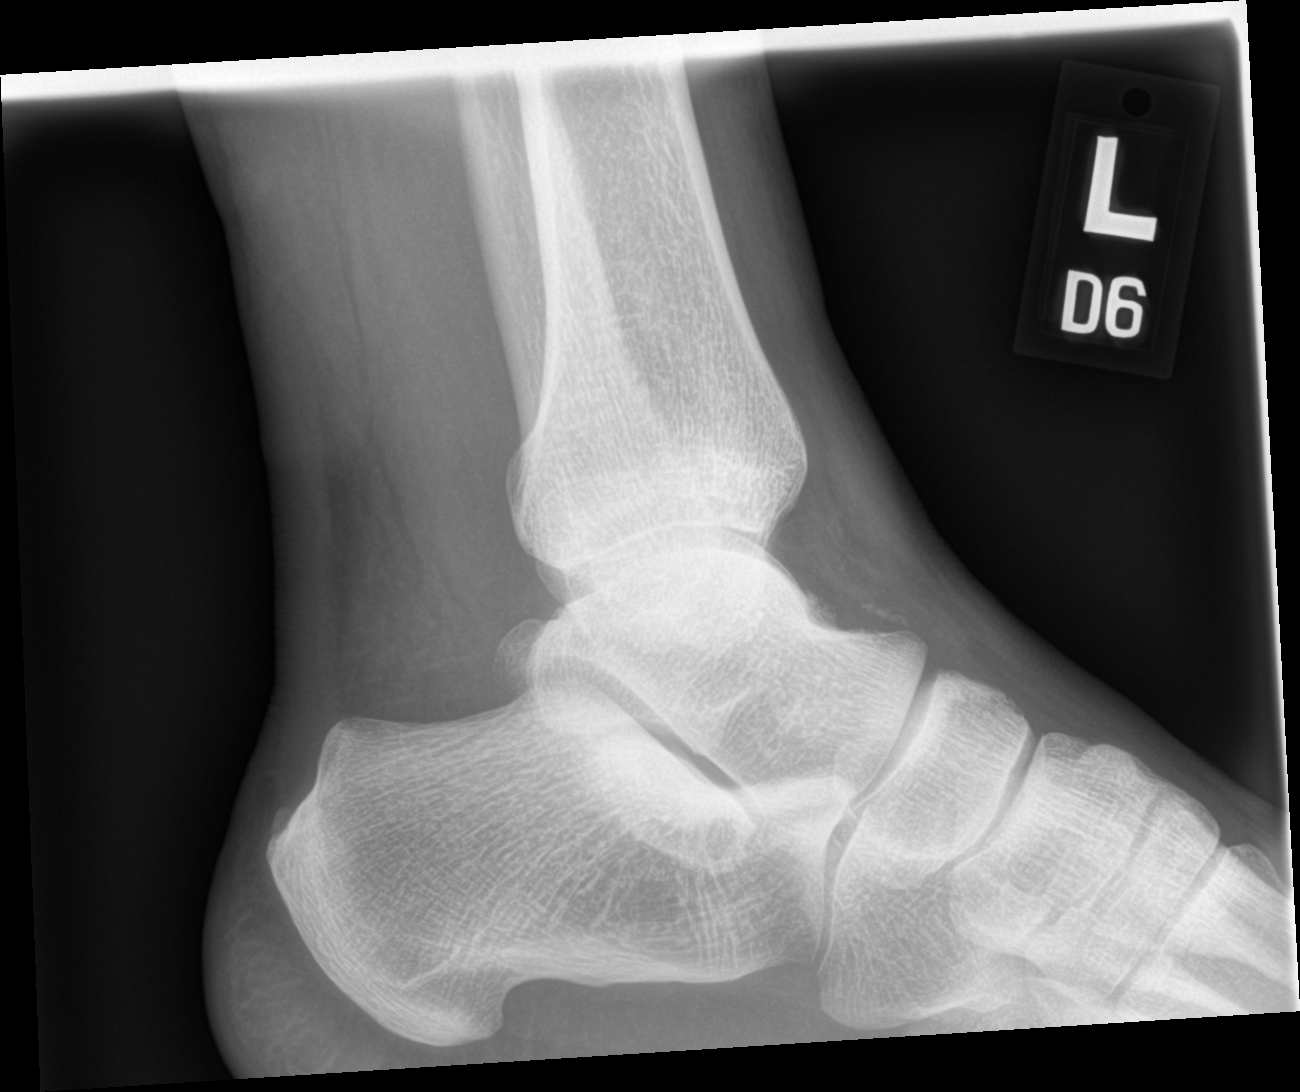

[3 of 3 positions shown; findings below may reference images not displayed]

FINDINGS: Small curvilinear ossification adjacent to the dorsal talus may
represent sequela of avulsion injury, not definitively seen on prior
exam. No other fracture of the ankle. Distal tibia and fibular
intact. The ankle mortise is preserved. No significant ankle joint
effusion. There is a small Achilles tendon enthesophyte. Decreasing
soft tissue edema from prior.
IMPRESSION: Small curvilinear ossification adjacent to the dorsal talus which
may represent sequela of avulsion injury, not definitively seen on
prior exam. No other fracture of the ankle.

## 2021-10-22 ENCOUNTER — Encounter: Payer: Self-pay | Admitting: Family Medicine

## 2021-10-22 ENCOUNTER — Other Ambulatory Visit: Payer: Self-pay

## 2021-10-22 ENCOUNTER — Ambulatory Visit (INDEPENDENT_AMBULATORY_CARE_PROVIDER_SITE_OTHER): Payer: No Typology Code available for payment source | Admitting: Family Medicine

## 2021-10-22 VITALS — BP 118/76 | HR 44 | Temp 98.1°F | Ht 64.0 in | Wt 148.4 lb

## 2021-10-22 DIAGNOSIS — Z0000 Encounter for general adult medical examination without abnormal findings: Secondary | ICD-10-CM | POA: Diagnosis not present

## 2021-10-22 DIAGNOSIS — R001 Bradycardia, unspecified: Secondary | ICD-10-CM | POA: Diagnosis not present

## 2021-10-22 DIAGNOSIS — Z23 Encounter for immunization: Secondary | ICD-10-CM | POA: Diagnosis not present

## 2021-10-22 DIAGNOSIS — E559 Vitamin D deficiency, unspecified: Secondary | ICD-10-CM

## 2021-10-22 DIAGNOSIS — D693 Immune thrombocytopenic purpura: Secondary | ICD-10-CM

## 2021-10-22 HISTORY — DX: Bradycardia, unspecified: R00.1

## 2021-10-22 LAB — CBC WITH DIFFERENTIAL/PLATELET
Basophils Absolute: 0 10*3/uL (ref 0.0–0.1)
Basophils Relative: 0.4 % (ref 0.0–3.0)
Eosinophils Absolute: 0.1 10*3/uL (ref 0.0–0.7)
Eosinophils Relative: 1.7 % (ref 0.0–5.0)
HCT: 42.3 % (ref 39.0–52.0)
Hemoglobin: 14 g/dL (ref 13.0–17.0)
Lymphocytes Relative: 40.9 % (ref 12.0–46.0)
Lymphs Abs: 1.6 10*3/uL (ref 0.7–4.0)
MCHC: 33.1 g/dL (ref 30.0–36.0)
MCV: 88.9 fl (ref 78.0–100.0)
Monocytes Absolute: 0.2 10*3/uL (ref 0.1–1.0)
Monocytes Relative: 6.5 % (ref 3.0–12.0)
Neutro Abs: 1.9 10*3/uL (ref 1.4–7.7)
Neutrophils Relative %: 50.5 % (ref 43.0–77.0)
Platelets: 130 10*3/uL — ABNORMAL LOW (ref 150.0–400.0)
RBC: 4.76 Mil/uL (ref 4.22–5.81)
RDW: 12.4 % (ref 11.5–15.5)
WBC: 3.8 10*3/uL — ABNORMAL LOW (ref 4.0–10.5)

## 2021-10-22 LAB — VITAMIN D 25 HYDROXY (VIT D DEFICIENCY, FRACTURES): VITD: 32.21 ng/mL (ref 30.00–100.00)

## 2021-10-22 NOTE — Patient Instructions (Signed)
Please return in 12 months for your annual complete physical; please come fasting.   I will release your lab results to you on your MyChart account with further instructions. You may see the results before I do, but when I review them I will send you a message with my report or have my assistant call you if things need to be discussed. Please reply to my message with any questions. Thank you!   Today you were given your Tdap vaccination.    If you have any questions or concerns, please don't hesitate to send me a message via MyChart or call the office at [phone]. Thank you for visiting with [place] today! It's our pleasure caring for you.   Please do these things to maintain good health!  Exercise at least 30-45 minutes a day,  4-5 days a week.  Eat a low-fat diet with lots of fruits and vegetables, up to 7-9 servings per day. Drink plenty of water daily. Try to drink 8 8oz glasses per day. Seatbelts can save your life. Always wear your seatbelt. Place Smoke Detectors on every level of your home and check batteries every year. Eye Doctor - have an eye exam every 1-2 years Safe sex - use condoms to protect yourself from STDs if you could be exposed to these types of infections. Avoid heavy alcohol use. If you drink, keep it to less than 2 drinks/day and not every day. Health Care Power of Attorney.  Choose someone you trust that could speak for you if you became unable to speak for yourself. Depression is common in our stressful world.If you're feeling down or losing interest in things you normally enjoy, please come in for a visit.

## 2021-10-22 NOTE — Progress Notes (Signed)
Subjective  Chief Complaint  Patient presents with   Annual Exam    Fasting   HPI: Louis Jennings is a 41 y.o. male who presents to [facility] today for a Male Wellness Visit.   Wellness Visit: annual visit with health maintenance review and exam   HM: due tdap;had flu shot through work. Doing great. Exercises regularly - gym and runner. Healthy brady heart. No concerns.  Body mass index is 25.47 kg/m. Wt Readings from Last 3 Encounters:  10/22/21 148 lb 6.4 oz (67.3 kg)  12/13/20 156 lb 9.6 oz (71 kg)  05/08/20 156 lb 6.4 oz (70.9 kg)    Patient Active Problem List   Diagnosis Date Noted   Sinus bradycardia, chronic 10/22/2021   Vitamin D deficiency 07/07/2015   Idiopathic thrombocytopenia ([place]) 07/07/2015   Health Maintenance  Topic Date Due   TETANUS/TDAP  09/09/2020   INFLUENZA VACCINE  Completed   Hepatitis C Screening  Completed   HIV Screening  Completed   HPV VACCINES  Aged Out   Immunization History  Administered Date(s) Administered   Hepatitis B 11/07/2003, 10/23/2007, 06/21/2008   Influenza,inj,Quad PF,6+ Mos 07/14/2014   Influenza-Unspecified 10/21/2007, 08/22/2008, 08/22/2008, 07/19/2013, 06/24/2015, 05/21/2017, 05/28/2018, 05/10/2021   MMR 07/01/2007, 02/08/2015   PPD Test 12/09/2013   Td 10/21/2007, 01/01/2008, 06/22/2008   Tdap 09/10/2007, 10/21/2007, 09/09/2010   We updated and reviewed the patient's past history in detail and it is documented below. Allergies: Patient has No Known Allergies. Past Medical History  has a past medical history of Sinus bradycardia, chronic (10/22/2021), Thrombocytopenia ([place]), and Vitamin D deficiency. Past Surgical History  has no past surgical history on file. Social History Patient  reports that he has never smoked. He has never used smokeless tobacco. He reports that he does not drink alcohol and does not use drugs. Family History Patient family history includes Healthy in his  daughter and son. Review of Systems: Constitutional: negative for fever or malaise Ophthalmic: negative for photophobia, double vision or loss of vision Cardiovascular: negative for chest pain, dyspnea on exertion, or new LE swelling Respiratory: negative for SOB or persistent cough Gastrointestinal: negative for abdominal pain, change in bowel habits or melena Genitourinary: negative for dysuria or gross hematuria Musculoskeletal: negative for new gait disturbance or muscular weakness Integumentary: negative for new or persistent rashes, no breast lumps Neurological: negative for TIA or stroke symptoms Psychiatric: negative for SI or delusions Allergic/Immunologic: negative for hives  Patient Care Team    Relationship Specialty Notifications Start End  Leamon Arnt, MD PCP - General Family Medicine  10/30/17    Objective  Vitals: BP 118/76    Pulse (!) 44    Temp 98.1 F (36.7 C) (Temporal)    Ht '5\' 4"'  (1.626 m)    Wt 148 lb 6.4 oz (67.3 kg)    SpO2 98%    BMI 25.47 kg/m  General:  Well developed, well nourished, no acute distress  Psych:  Alert and orientedx3,normal mood and affect HEENT:  Normocephalic, atraumatic, non-icteric sclera, PERRL, oropharynx is clear without mass or exudate, supple neck without adenopathy, mass or thyromegaly Cardiovascular:  Normal S1, S2, brady reg rhythm without gallop, rub or murmur, nondisplaced PMI, +2 distal pulses in bilateral upper and lower extremities. Respiratory:  Good breath sounds bilaterally, CTAB with normal respiratory effort Gastrointestinal: normal bowel sounds, soft, non-tender, no noted masses. No HSM MSK: no deformities, contusions. Joints are without erythema or swelling. Spine and CVA  region are nontender Skin:  Warm, no rashes or suspicious lesions noted Neurologic:    Mental status is normal. Gross motor and sensory exams are normal. Stable gait. No tremor GU: No inguinal hernias or adenopathy are appreciated  bilaterally  Assessment  1. Annual physical exam   2. Idiopathic thrombocytopenia (HCC)   3. Vitamin D deficiency   4. Sinus bradycardia, chronic      Plan  Male Wellness Visit: Age appropriate Health Maintenance and Prevention measures were discussed with patient. Included topics are cancer screening recommendations, ways to keep healthy (see AVS) including dietary and exercise recommendations, regular eye and dental care, use of seat belts, and avoidance of moderate alcohol use and tobacco use.  BMI: discussed patient's BMI and encouraged positive lifestyle modifications to help get to or maintain a target BMI. HM needs and immunizations were addressed and ordered. See below for orders. See HM and immunization section for updates. Tdap today Routine labs and screening tests ordered including cmp, cbc and lipids where appropriate. Discussed recommendations regarding Vit D and calcium supplementation (see AVS)  Follow up: Return in about 1 year (around 10/22/2022) for complete physical.  Commons side effects, risks, benefits, and alternatives for medications and treatment plan prescribed today were discussed, and the patient expressed understanding of the given instructions. Patient is instructed to call or message via MyChart if he/she has any questions or concerns regarding our treatment plan. No barriers to understanding were identified. We discussed Red Flag symptoms and signs in detail. Patient expressed understanding regarding what to do in case of urgent or emergency type symptoms.  Medication list was reconciled, printed and provided to the patient in AVS. Patient instructions and summary information was reviewed with the patient as documented in the AVS. This note was prepared with assistance of Dragon voice recognition software. Occasional wrong-word or sound-a-like substitutions may have occurred due to the inherent limitations of voice recognition software This visit occurred during  the SARS-CoV-2 public health emergency.  Safety protocols were in place, including screening questions prior to the visit, additional usage of staff PPE, and extensive cleaning of exam room while observing appropriate contact time as indicated for disinfecting solutions.   Orders Placed This Encounter  Procedures   Tdap vaccine greater than or equal to 7yo IM   CBC with Differential/Platelet   Comprehensive metabolic panel   Lipid panel   VITAMIN D 25 Hydroxy (Vit-D Deficiency, Fractures)   No orders of the defined types were placed in this encounter.

## 2021-10-23 LAB — LIPID PANEL
Cholesterol: 157 mg/dL (ref 0–200)
HDL: 55 mg/dL (ref >39.00)
LDL Cholesterol: 83 mg/dL (ref 0–99)
NonHDL: 102.46
Total CHOL/HDL Ratio: 3
Triglycerides: 96 mg/dL (ref 0.0–149.0)
VLDL: 19.2 mg/dL (ref 0.0–40.0)

## 2021-10-23 LAB — COMPREHENSIVE METABOLIC PANEL
ALT: 23 U/L (ref 0–53)
AST: 29 U/L (ref 0–37)
Albumin: 4.4 g/dL (ref 3.5–5.2)
Alkaline Phosphatase: 45 U/L (ref 39–117)
BUN: 19 mg/dL (ref 6–23)
CO2: 31 mEq/L (ref 19–32)
Calcium: 9.5 mg/dL (ref 8.4–10.5)
Chloride: 106 mEq/L (ref 96–112)
Creatinine, Ser: 0.93 mg/dL (ref 0.40–1.50)
GFR: 102.02 mL/min (ref >60.00)
Glucose, Bld: 84 mg/dL (ref 70–99)
Potassium: 4.3 mEq/L (ref 3.5–5.1)
Sodium: 141 mEq/L (ref 135–145)
Total Bilirubin: 0.4 mg/dL (ref 0.2–1.2)
Total Protein: 6.6 g/dL (ref 6.0–8.3)

## 2022-06-03 ENCOUNTER — Encounter: Payer: Self-pay | Admitting: *Deleted

## 2022-08-22 ENCOUNTER — Encounter: Payer: Self-pay | Admitting: *Deleted

## 2022-10-25 ENCOUNTER — Encounter: Payer: Self-pay | Admitting: Family Medicine

## 2022-10-25 ENCOUNTER — Other Ambulatory Visit (HOSPITAL_COMMUNITY): Payer: Self-pay

## 2022-10-25 ENCOUNTER — Ambulatory Visit (INDEPENDENT_AMBULATORY_CARE_PROVIDER_SITE_OTHER): Payer: 59 | Admitting: Family Medicine

## 2022-10-25 VITALS — BP 160/92 | HR 53 | Temp 98.2°F | Ht 64.0 in | Wt 156.2 lb

## 2022-10-25 DIAGNOSIS — I10 Essential (primary) hypertension: Secondary | ICD-10-CM | POA: Diagnosis not present

## 2022-10-25 DIAGNOSIS — R001 Bradycardia, unspecified: Secondary | ICD-10-CM | POA: Diagnosis not present

## 2022-10-25 DIAGNOSIS — D693 Immune thrombocytopenic purpura: Secondary | ICD-10-CM | POA: Diagnosis not present

## 2022-10-25 LAB — CBC WITH DIFFERENTIAL/PLATELET
Basophils Absolute: 0 10*3/uL (ref 0.0–0.1)
Basophils Relative: 0.3 % (ref 0.0–3.0)
Eosinophils Absolute: 0.1 10*3/uL (ref 0.0–0.7)
Eosinophils Relative: 1.7 % (ref 0.0–5.0)
HCT: 42.3 % (ref 39.0–52.0)
Hemoglobin: 14.4 g/dL (ref 13.0–17.0)
Lymphocytes Relative: 39.4 % (ref 12.0–46.0)
Lymphs Abs: 1.8 10*3/uL (ref 0.7–4.0)
MCHC: 34.1 g/dL (ref 30.0–36.0)
MCV: 85.7 fl (ref 78.0–100.0)
Monocytes Absolute: 0.3 10*3/uL (ref 0.1–1.0)
Monocytes Relative: 7.3 % (ref 3.0–12.0)
Neutro Abs: 2.4 10*3/uL (ref 1.4–7.7)
Neutrophils Relative %: 51.3 % (ref 43.0–77.0)
Platelets: 143 10*3/uL — ABNORMAL LOW (ref 150.0–400.0)
RBC: 4.93 Mil/uL (ref 4.22–5.81)
RDW: 12.6 % (ref 11.5–15.5)
WBC: 4.7 10*3/uL (ref 4.0–10.5)

## 2022-10-25 LAB — COMPREHENSIVE METABOLIC PANEL
ALT: 23 U/L (ref 0–53)
AST: 37 U/L (ref 0–37)
Albumin: 4.5 g/dL (ref 3.5–5.2)
Alkaline Phosphatase: 63 U/L (ref 39–117)
BUN: 14 mg/dL (ref 6–23)
CO2: 30 mEq/L (ref 19–32)
Calcium: 9.5 mg/dL (ref 8.4–10.5)
Chloride: 104 mEq/L (ref 96–112)
Creatinine, Ser: 1.07 mg/dL (ref 0.40–1.50)
GFR: 85.62 mL/min (ref >60.00)
Glucose, Bld: 85 mg/dL (ref 70–99)
Potassium: 4 mEq/L (ref 3.5–5.1)
Sodium: 141 mEq/L (ref 135–145)
Total Bilirubin: 0.5 mg/dL (ref 0.2–1.2)
Total Protein: 6.8 g/dL (ref 6.0–8.3)

## 2022-10-25 LAB — LIPID PANEL
Cholesterol: 176 mg/dL (ref 0–200)
HDL: 54.5 mg/dL (ref >39.00)
LDL Cholesterol: 107 mg/dL — ABNORMAL HIGH (ref 0–99)
NonHDL: 121.56
Total CHOL/HDL Ratio: 3
Triglycerides: 74 mg/dL (ref 0.0–149.0)
VLDL: 14.8 mg/dL (ref 0.0–40.0)

## 2022-10-25 LAB — TSH: TSH: 1.55 u[IU]/mL (ref 0.35–5.50)

## 2022-10-25 MED ORDER — HYDROCHLOROTHIAZIDE 12.5 MG PO CAPS
12.5000 mg | ORAL_CAPSULE | Freq: Every day | ORAL | 3 refills | Status: DC
  Filled 2022-10-25: qty 90, 90d supply, fill #0
  Filled 2023-01-27 – 2023-02-05 (×2): qty 90, 90d supply, fill #1
  Filled 2023-05-01 – 2023-06-09 (×2): qty 90, 90d supply, fill #2
  Filled 2023-09-29: qty 90, 90d supply, fill #3

## 2022-10-25 NOTE — Patient Instructions (Signed)
Please return in 3 months for your annual complete physical; please come fasting. And to recheck blood pressure. Keep monitoring it at home on the medication.   I will release your lab results to you on your MyChart account with further instructions. You may see the results before I do, but when I review them I will send you a message with my report or have my assistant call you if things need to be discussed. Please reply to my message with any questions. Thank you!   If you have any questions or concerns, please don't hesitate to send me a message via MyChart or call the office at [phone]. Thank you for visiting with [place] today! It's our pleasure caring for you.   Hypertension, Adult High blood pressure (hypertension) is when the force of blood pumping through the arteries is too strong. The arteries are the blood vessels that carry blood from the heart throughout the body. Hypertension forces the heart to work harder to pump blood and may cause arteries to become narrow or stiff. Untreated or uncontrolled hypertension can lead to a heart attack, heart failure, a stroke, kidney disease, and other problems. A blood pressure reading consists of a higher number over a lower number. Ideally, your blood pressure should be below 120/80. The first ("top") number is called the systolic pressure. It is a measure of the pressure in your arteries as your heart beats. The second ("bottom") number is called the diastolic pressure. It is a measure of the pressure in your arteries as the heart relaxes. What are the causes? The exact cause of this condition is not known. There are some conditions that result in high blood pressure. What increases the risk? Certain factors may make you more likely to develop high blood pressure. Some of these risk factors are under your control, including: Smoking. Not getting enough exercise or physical activity. Being overweight. Having too much fat, sugar, calories, or salt  (sodium) in your diet. Drinking too much alcohol. Other risk factors include: Having a personal history of heart disease, diabetes, high cholesterol, or kidney disease. Stress. Having a family history of high blood pressure and high cholesterol. Having obstructive sleep apnea. Age. The risk increases with age. What are the signs or symptoms? High blood pressure may not cause symptoms. Very high blood pressure (hypertensive crisis) may cause: Headache. Fast or irregular heartbeats (palpitations). Shortness of breath. Nosebleed. Nausea and vomiting. Vision changes. Severe chest pain, dizziness, and seizures. How is this diagnosed? This condition is diagnosed by measuring your blood pressure while you are seated, with your arm resting on a flat surface, your legs uncrossed, and your feet flat on the floor. The cuff of the blood pressure monitor will be placed directly against the skin of your upper arm at the level of your heart. Blood pressure should be measured at least twice using the same arm. Certain conditions can cause a difference in blood pressure between your right and left arms. If you have a high blood pressure reading during one visit or you have normal blood pressure with other risk factors, you may be asked to: Return on a different day to have your blood pressure checked again. Monitor your blood pressure at home for 1 week or longer. If you are diagnosed with hypertension, you may have other blood or imaging tests to help your health care provider understand your overall risk for other conditions. How is this treated? This condition is treated by making healthy lifestyle changes, such as  eating healthy foods, exercising more, and reducing your alcohol intake. You may be referred for counseling on a healthy diet and physical activity. Your health care provider may prescribe medicine if lifestyle changes are not enough to get your blood pressure under control and if: Your  systolic blood pressure is above 130. Your diastolic blood pressure is above 80. Your personal target blood pressure may vary depending on your medical conditions, your age, and other factors. Follow these instructions at home: Eating and drinking  Eat a diet that is high in fiber and potassium, and low in sodium, added sugar, and fat. An example of this eating plan is called the DASH diet. DASH stands for Dietary Approaches to Stop Hypertension. To eat this way: Eat plenty of fresh fruits and vegetables. Try to fill one half of your plate at each meal with fruits and vegetables. Eat whole grains, such as whole-wheat pasta, brown rice, or whole-grain bread. Fill about one fourth of your plate with whole grains. Eat or drink low-fat dairy products, such as skim milk or low-fat yogurt. Avoid fatty cuts of meat, processed or cured meats, and poultry with skin. Fill about one fourth of your plate with lean proteins, such as fish, chicken without skin, beans, eggs, or tofu. Avoid pre-made and processed foods. These tend to be higher in sodium, added sugar, and fat. Reduce your daily sodium intake. Many people with hypertension should eat less than 1,500 mg of sodium a day. Do not drink alcohol if: Your health care provider tells you not to drink. You are pregnant, may be pregnant, or are planning to become pregnant. If you drink alcohol: Limit how much you have to: 0-1 drink a day for women. 0-2 drinks a day for men. Know how much alcohol is in your drink. In the [place] one drink equals one 12 oz bottle of beer (355 mL), one 5 oz glass of wine (148 mL), or one 1 oz glass of hard liquor (44 mL). Lifestyle  Work with your health care provider to maintain a healthy body weight or to lose weight. Ask what an ideal weight is for you. Get at least 30 minutes of exercise that causes your heart to beat faster (aerobic exercise) most days of the week. Activities may include walking, swimming, or  biking. Include exercise to strengthen your muscles (resistance exercise), such as Pilates or lifting weights, as part of your weekly exercise routine. Try to do these types of exercises for 30 minutes at least 3 days a week. Do not use any products that contain nicotine or tobacco. These products include cigarettes, chewing tobacco, and vaping devices, such as e-cigarettes. If you need help quitting, ask your health care provider. Monitor your blood pressure at home as told by your health care provider. Keep all follow-up visits. This is important. Medicines Take over-the-counter and prescription medicines only as told by your health care provider. Follow directions carefully. Blood pressure medicines must be taken as prescribed. Do not skip doses of blood pressure medicine. Doing this puts you at risk for problems and can make the medicine less effective. Ask your health care provider about side effects or reactions to medicines that you should watch for. Contact a health care provider if you: Think you are having a reaction to a medicine you are taking. Have headaches that keep coming back (recurring). Feel dizzy. Have swelling in your ankles. Have trouble with your vision. Get help right away if you: Develop a severe headache or confusion. Have  unusual weakness or numbness. Feel faint. Have severe pain in your chest or abdomen. Vomit repeatedly. Have trouble breathing. These symptoms may be an emergency. Get help right away. Call 911. Do not wait to see if the symptoms will go away. Do not drive yourself to the hospital. Summary Hypertension is when the force of blood pumping through your arteries is too strong. If this condition is not controlled, it may put you at risk for serious complications. Your personal target blood pressure may vary depending on your medical conditions, your age, and other factors. For most people, a normal blood pressure is less than 120/80. Hypertension is  treated with lifestyle changes, medicines, or a combination of both. Lifestyle changes include losing weight, eating a healthy, low-sodium diet, exercising more, and limiting alcohol. This information is not intended to replace advice given to you by your health care provider. Make sure you discuss any questions you have with your health care provider. Document Revised: 07/03/2021 Document Reviewed: 07/03/2021 Elsevier Patient Education  [place].

## 2022-10-25 NOTE — Progress Notes (Signed)
Subjective  CC:  Chief Complaint  Patient presents with   Hypertension    HPI: Louis Jennings is a 42 y.o. male who presents to the office today to address the problems listed above in the chief complaint. 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  Random monitoring has been as high as 160s over 90s.  After sitting and resting, it will come down to 130s over 80s.  However, it is often elevated.  New diagnosis.  He otherwise feels well.  Exercises regularly.  Has a stressful job but does not know stress changes because of that.  No palpitations, chest pain, shortness of breath, change in lower extremity edema (has mild pedal edema after a long shift), sweats, tremor or change in fitness level.  He eats healthy.  Father passed early nonaccidental drowning, mother had mild hypertension.  No strong family history of hypertension nor early heart disease.  He does not take prescription medicines or over-the-counter supplements.  He feels well.  Assessment  1. Essential hypertension   2. Idiopathic thrombocytopenia (HCC)   3. Sinus bradycardia, chronic      Plan   Hypertension, new diagnosis: No red flag symptoms.  Recommend Microzide 12.5 mg daily.  Check renal function, electrolytes, lipids and thyroid today.  He will monitor his blood pressure at home and recheck in 3 months.  Low-sodium diet recommended.  See after visit summary for education.  Education regarding management of these chronic disease states was given. Management strategies discussed on successive visits include dietary and exercise recommendations, goals of achieving and maintaining IBW, and lifestyle modifications aiming for adequate sleep and minimizing stressors.   Follow up: 3 months for physical and repeat blood pressure check  Orders Placed This Encounter  Procedures   CBC with Differential/Platelet    Comprehensive metabolic panel   Lipid panel   TSH   Meds ordered this encounter  Medications   hydrochlorothiazide (MICROZIDE) 12.5 MG capsule    Sig: Take 1 capsule (12.5 mg total) by mouth daily.    Dispense:  90 capsule    Refill:  3      BP Readings from Last 3 Encounters:  10/25/22 (!) 160/92  10/22/21 118/76  12/13/20 128/86   Wt Readings from Last 3 Encounters:  10/25/22 156 lb 3.2 oz (70.9 kg)  10/22/21 148 lb 6.4 oz (67.3 kg)  12/13/20 156 lb 9.6 oz (71 kg)    Lab Results  Component Value Date   CHOL 157 10/22/2021   CHOL 144 05/08/2020   CHOL 190 02/10/2019   Lab Results  Component Value Date   HDL 55.00 10/22/2021   HDL 44 05/08/2020   HDL 44.30 02/10/2019   Lab Results  Component Value Date   LDLCALC 83 10/22/2021   LDLCALC 85 05/08/2020   LDLCALC 84 10/30/2017   Lab Results  Component Value Date   TRIG 96.0 10/22/2021   TRIG 67 05/08/2020   TRIG 226.0 (H) 02/10/2019   Lab Results  Component Value Date   CHOLHDL 3 10/22/2021   CHOLHDL 3.3 05/08/2020   CHOLHDL 4 02/10/2019   Lab Results  Component Value Date   LDLDIRECT 103.0 02/10/2019   Lab Results  Component Value Date   CREATININE 0.93 10/22/2021   BUN 19 10/22/2021   NA 141 10/22/2021   K 4.3 10/22/2021   CL 106 10/22/2021   CO2 31 10/22/2021  The 10-year ASCVD risk score (Arnett DK, et al., 2019) is: 7.9%   Values used to calculate the score:     Age: 42 years     Sex: Male     Is Non-Hispanic African American: Yes     Diabetic: No     Tobacco smoker: No     Systolic Blood Pressure: 0000000 mmHg     Is BP treated: Yes     HDL Cholesterol: 55 mg/dL     Total Cholesterol: 157 mg/dL  I reviewed the patients updated PMH, FH, and SocHx.    Patient Active Problem List   Diagnosis Date Noted   Sinus bradycardia, chronic 10/22/2021   Vitamin D deficiency 07/07/2015   Idiopathic thrombocytopenia ([place]) 07/07/2015    Allergies: Patient has no known allergies.  Social  History: Patient  reports that he has never smoked. He has never used smokeless tobacco. He reports that he does not drink alcohol and does not use drugs.  Current Meds  Medication Sig   hydrochlorothiazide (MICROZIDE) 12.5 MG capsule Take 1 capsule (12.5 mg total) by mouth daily.    Review of Systems: Cardiovascular: negative for chest pain, palpitations, leg swelling, orthopnea Respiratory: negative for SOB, wheezing or persistent cough Gastrointestinal: negative for abdominal pain Genitourinary: negative for dysuria or gross hematuria  Objective  Vitals: BP (!) 160/92   Pulse (!) 53   Temp 98.2 F (36.8 C)   Ht 5' 4"$  (1.626 m)   Wt 156 lb 3.2 oz (70.9 kg)   SpO2 99%   BMI 26.81 kg/m  General: no acute distress  Psych:  Alert and oriented, normal mood and affect HEENT:  Normocephalic, atraumatic, supple neck  Cardiovascular:  RRR without murmur. no edema Respiratory:  Good breath sounds bilaterally, CTAB with normal respiratory effort Skin:  Warm, no rashes Neurologic:   Mental status is normal [facility] side effects, risks, benefits, and alternatives for medications and treatment plan prescribed today were discussed, and the patient expressed understanding of the given instructions. Patient is instructed to call or message via MyChart if he/she has any questions or concerns regarding our treatment plan. No barriers to understanding were identified. We discussed Red Flag symptoms and signs in detail. Patient expressed understanding regarding what to do in case of urgent or emergency type symptoms.  Medication list was reconciled, printed and provided to the patient in AVS. Patient instructions and summary information was reviewed with the patient as documented in the AVS. This note was prepared with assistance of Dragon voice recognition software. Occasional wrong-word or sound-a-like substitutions may have occurred due to the inherent limitation

## 2022-10-26 ENCOUNTER — Other Ambulatory Visit (HOSPITAL_COMMUNITY): Payer: Self-pay

## 2023-01-20 ENCOUNTER — Encounter: Payer: 59 | Admitting: Family Medicine

## 2023-01-23 ENCOUNTER — Ambulatory Visit (INDEPENDENT_AMBULATORY_CARE_PROVIDER_SITE_OTHER): Payer: 59 | Admitting: Family Medicine

## 2023-01-23 ENCOUNTER — Encounter: Payer: Self-pay | Admitting: Family Medicine

## 2023-01-23 VITALS — BP 118/70 | HR 68 | Temp 97.7°F | Ht 64.0 in | Wt 149.8 lb

## 2023-01-23 DIAGNOSIS — R001 Bradycardia, unspecified: Secondary | ICD-10-CM | POA: Diagnosis not present

## 2023-01-23 DIAGNOSIS — Z0000 Encounter for general adult medical examination without abnormal findings: Secondary | ICD-10-CM

## 2023-01-23 DIAGNOSIS — D693 Immune thrombocytopenic purpura: Secondary | ICD-10-CM

## 2023-01-23 DIAGNOSIS — I10 Essential (primary) hypertension: Secondary | ICD-10-CM | POA: Diagnosis not present

## 2023-01-23 DIAGNOSIS — Z0001 Encounter for general adult medical examination with abnormal findings: Secondary | ICD-10-CM

## 2023-01-23 DIAGNOSIS — R011 Cardiac murmur, unspecified: Secondary | ICD-10-CM

## 2023-01-23 DIAGNOSIS — E559 Vitamin D deficiency, unspecified: Secondary | ICD-10-CM

## 2023-01-23 NOTE — Progress Notes (Signed)
Subjective  Chief Complaint  Patient presents with   Annual Exam    Pt here for Annual Exam and is not currently fasting     HPI: Louis Jennings is a 42 y.o. male who presents to [facility] [place] today for a Male Wellness Visit. He also has the concerns and/or needs as listed above in the chief complaint. These will be addressed in addition to the Health Maintenance Visit.   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  Happy and feels well.  No concerns  Body mass index is 25.71 kg/m. Wt Readings from Last 3 Encounters:  01/23/23 149 lb 12.8 oz (67.9 kg)  10/25/22 156 lb 3.2 oz (70.9 kg)  10/22/21 148 lb 6.4 oz (67.3 kg)   Chronic disease management visit and/or acute problem visit: New onset hypertension: Started hydrochlorothiazide 12.5 mg daily about 3 months ago and blood pressures have responded nicely.  Now running normal at home.  Feels well.  Had recent lab work with Drs. Days labs and potassium was 4.1.  No adverse effects.  No chest pain or palpitations.  I reviewed lab work from February including normal CBC except for thrombocytopenia, CMP, TSH and lipid panel.  See below Idiopathic thrombocytopenia is stable History of vitamin D deficiency with normal levels last year.  Takes vitamin D intermittently  Assessment  1. Annual physical exam   2. Essential hypertension   3. Idiopathic thrombocytopenia (HCC)   4. Sinus bradycardia, chronic   5. Vitamin D deficiency   6. Systolic murmur      Plan  Male Wellness Visit: Age appropriate Health Maintenance and Prevention measures were discussed with patient. Included topics are cancer screening recommendations, ways to keep healthy (see AVS) including dietary and exercise recommendations, regular eye and dental care, use of seat belts, and avoidance of moderate alcohol use  and tobacco use.  BMI: discussed patient's BMI and encouraged positive lifestyle modifications to help get to or maintain a target BMI. HM needs and immunizations were addressed and ordered. See below for orders. See HM and immunization section for updates. Routine labs and screening tests ordered including cmp, cbc and lipids where appropriate. Discussed recommendations regarding Vit D and calcium supplementation (see AVS)  Chronic disease f/u and/or acute problem visit: (deemed necessary to be done in addition to the wellness visit): Hypertension now well-controlled on 12.5 mg Microzide daily.  Normal renal function and electrolytes.  Continue. New soft systolic murmur on exam today.  Check echocardiogram to evaluate for valvular disease and left ventricular function. Chronic sinus bradycardia due to athletic heart most likely. Idiopathic thrombocytopenia is stable.  Follow up: 12 months for complete physical, he will monitor his blood pressure at work Commons side effects, risks, benefits, and alternatives for medications and treatment plan prescribed today were discussed, and the patient expressed understanding of the given instructions. Patient is instructed to call or message via MyChart if he/she has any questions or concerns regarding our treatment plan. No barriers to understanding were identified. We discussed Red Flag symptoms and signs in detail. Patient expressed understanding regarding what to do in case of urgent or emergency type symptoms.  Medication list was reconciled, printed and provided to the patient in AVS. Patient instructions and summary information was reviewed with the patient as documented in the AVS. This note was prepared with assistance of Dragon voice recognition software.  Occasional wrong-word or sound-a-like substitutions may have occurred due to the inherent limitations of voice recognition software  Orders Placed This Encounter  Procedures   ECHOCARDIOGRAM  COMPLETE   No orders of the defined types were placed in this encounter.    Patient Active Problem List   Diagnosis Date Noted   Essential hypertension 01/23/2023   Sinus bradycardia, chronic 10/22/2021   Vitamin D deficiency 07/07/2015   Idiopathic thrombocytopenia (HCC) 07/07/2015   Health Maintenance  Topic Date Due   COVID-19 Vaccine (1) 02/08/2023 (Originally 11/06/1981)   INFLUENZA VACCINE  04/10/2023   DTaP/Tdap/Td (8 - Td or Tdap) 10/23/2031   Hepatitis C Screening  Completed   HIV Screening  Completed   HPV VACCINES  Aged Out   Immunization History  Administered Date(s) Administered   Hepatitis B 11/07/2003, 10/23/2007, 06/21/2008   Influenza,inj,Quad PF,6+ Mos 07/14/2014, 05/24/2022   Influenza-Unspecified 10/21/2007, 08/22/2008, 08/22/2008, 07/19/2013, 06/24/2015, 05/21/2017, 05/28/2018, 05/10/2021   MMR 07/01/2007, 02/08/2015   PPD Test 12/09/2013   Td 10/21/2007, 01/01/2008, 06/22/2008   Tdap 09/10/2007, 10/21/2007, 09/09/2010, 10/22/2021   We updated and reviewed the patient's past history in detail and it is documented below. Allergies: Patient has No Known Allergies. Past Medical History  has a past medical history of Sinus bradycardia, chronic (10/22/2021), Thrombocytopenia (HCC), and Vitamin D deficiency. Past Surgical History Patient  has no past surgical history on file. Social History Patient  reports that he has never smoked. He has never used smokeless tobacco. He reports that he does not drink alcohol and does not use drugs. Family History family history includes Healthy in his daughter and son. Review of Systems: Constitutional: negative for fever or malaise Ophthalmic: negative for photophobia, double vision or loss of vision Cardiovascular: negative for chest pain, dyspnea on exertion, or new LE swelling Respiratory: negative for SOB or persistent cough Gastrointestinal: negative for abdominal pain, change in bowel habits or  melena Genitourinary: negative for dysuria or gross hematuria Musculoskeletal: negative for new gait disturbance or muscular weakness Integumentary: negative for new or persistent rashes Neurological: negative for TIA or stroke symptoms Psychiatric: negative for SI or delusions Allergic/Immunologic: negative for hives  Patient Care Team    Relationship Specialty Notifications Start End  Willow Ora, MD PCP - General Family Medicine  10/30/17    Objective  Vitals: BP 118/70   Pulse 68   Temp 97.7 F (36.5 C)   Ht 5\' 4"  (1.626 m)   Wt 149 lb 12.8 oz (67.9 kg)   SpO2 98%   BMI 25.71 kg/m  General:  Well developed, well nourished, no acute distress  Psych:  Alert and orientedx3,normal mood and affect HEENT:  Normocephalic, atraumatic, non-icteric sclera,  oropharynx is clear without mass or exudate, supple neck without adenopathy, or thyromegaly Cardiovascular:  Normal S1, S2, RRR without gallop, rub, new soft systolic murmur heard best at right upper sternal border murmur,  Respiratory:  Good breath sounds bilaterally, CTAB with normal respiratory effort Gastrointestinal: normal bowel sounds, soft, non-tender, no noted masses. No HSM MSK: Joints are without erythema or swelling.  Skin:  Warm, no rashes Neurologic:    Mental status is normal.  Gross motor and sensory exams are normal. Stable gait. No tremor   No visits with results within 1 Day(s) from this visit.  Latest known visit with results is:  Office Visit on 10/25/2022  Component Date Value Ref Range Status   WBC 10/25/2022 4.7  4.0 - 10.5 K/uL Final   RBC 10/25/2022 4.93  4.22 - 5.81 Mil/uL Final   Hemoglobin 10/25/2022 14.4  13.0 - 17.0 g/dL Final   HCT 16/06/9603 42.3  39.0 - 52.0 % Final   MCV 10/25/2022 85.7  78.0 - 100.0 fl Final   MCHC 10/25/2022 34.1  30.0 - 36.0 g/dL Final   RDW 54/05/8118 12.6  11.5 - 15.5 % Final   Platelets 10/25/2022 143.0 (L)  150.0 - 400.0 K/uL Final   Neutrophils Relative %  10/25/2022 51.3  43.0 - 77.0 % Final   Lymphocytes Relative 10/25/2022 39.4  12.0 - 46.0 % Final   Monocytes Relative 10/25/2022 7.3  3.0 - 12.0 % Final   Eosinophils Relative 10/25/2022 1.7  0.0 - 5.0 % Final   Basophils Relative 10/25/2022 0.3  0.0 - 3.0 % Final   Neutro Abs 10/25/2022 2.4  1.4 - 7.7 K/uL Final   Lymphs Abs 10/25/2022 1.8  0.7 - 4.0 K/uL Final   Monocytes Absolute 10/25/2022 0.3  0.1 - 1.0 K/uL Final   Eosinophils Absolute 10/25/2022 0.1  0.0 - 0.7 K/uL Final   Basophils Absolute 10/25/2022 0.0  0.0 - 0.1 K/uL Final   Sodium 10/25/2022 141  135 - 145 mEq/L Final   Potassium 10/25/2022 4.0  3.5 - 5.1 mEq/L Final   Chloride 10/25/2022 104  96 - 112 mEq/L Final   CO2 10/25/2022 30  19 - 32 mEq/L Final   Glucose, Bld 10/25/2022 85  70 - 99 mg/dL Final   BUN 14/78/2956 14  6 - 23 mg/dL Final   Creatinine, Ser 10/25/2022 1.07  0.40 - 1.50 mg/dL Final   Total Bilirubin 10/25/2022 0.5  0.2 - 1.2 mg/dL Final   Alkaline Phosphatase 10/25/2022 63  39 - 117 U/L Final   AST 10/25/2022 37  0 - 37 U/L Final   ALT 10/25/2022 23  0 - 53 U/L Final   Total Protein 10/25/2022 6.8  6.0 - 8.3 g/dL Final   Albumin 21/30/8657 4.5  3.5 - 5.2 g/dL Final   GFR 84/69/6295 85.62  >60.00 mL/min Final   Calcium 10/25/2022 9.5  8.4 - 10.5 mg/dL Final   Cholesterol 28/41/3244 176  0 - 200 mg/dL Final   Triglycerides 09/11/7251 74.0  0.0 - 149.0 mg/dL Final   HDL 66/44/0347 54.50  >39.00 mg/dL Final   VLDL 42/59/5638 14.8  0.0 - 40.0 mg/dL Final   LDL Cholesterol 10/25/2022 107 (H)  0 - 99 mg/dL Final   Total CHOL/HDL Ratio 10/25/2022 3   Final   NonHDL 10/25/2022 121.56   Final   TSH 10/25/2022 1.55  0.35 - 5.50 uIU/mL Final   The 10-year ASCVD risk score (Arnett DK, et al., 2019) is: 4.7%   Values used to calculate the score:     Age: 72 years     Sex: Male     Is Non-Hispanic African American: Yes     Diabetic: No     Tobacco smoker: No     Systolic Blood Pressure: 118 mmHg     Is BP  treated: Yes     HDL Cholesterol: 54.5 mg/dL     Total Cholesterol: 176 mg/dL

## 2023-01-24 DIAGNOSIS — H5213 Myopia, bilateral: Secondary | ICD-10-CM | POA: Diagnosis not present

## 2023-01-27 ENCOUNTER — Other Ambulatory Visit (HOSPITAL_COMMUNITY): Payer: Self-pay

## 2023-02-04 ENCOUNTER — Other Ambulatory Visit (HOSPITAL_COMMUNITY): Payer: Self-pay

## 2023-02-05 ENCOUNTER — Other Ambulatory Visit (HOSPITAL_COMMUNITY): Payer: Self-pay

## 2023-02-20 ENCOUNTER — Ambulatory Visit (HOSPITAL_COMMUNITY)
Admission: RE | Admit: 2023-02-20 | Discharge: 2023-02-20 | Disposition: A | Discharge status: Home / Self Care | Payer: 59 | Source: Ambulatory Visit | Attending: Family Medicine | Admitting: Family Medicine

## 2023-02-20 DIAGNOSIS — I10 Essential (primary) hypertension: Secondary | ICD-10-CM | POA: Insufficient documentation

## 2023-02-20 DIAGNOSIS — R001 Bradycardia, unspecified: Secondary | ICD-10-CM | POA: Insufficient documentation

## 2023-02-20 DIAGNOSIS — R011 Cardiac murmur, unspecified: Secondary | ICD-10-CM | POA: Insufficient documentation

## 2023-02-20 LAB — ECHOCARDIOGRAM COMPLETE
Area-P 1/2: 3.12 cm2
Calc EF: 55.5 %
S' Lateral: 3.1 cm
Single Plane A2C EF: 54.8 %
Single Plane A4C EF: 57.4 %

## 2023-02-20 NOTE — Progress Notes (Signed)
Echocardiogram 2D Echocardiogram has been performed.  Louis Jennings 02/20/2023, 12:00 PM

## 2023-02-24 NOTE — Progress Notes (Signed)
See my chart note.

## 2023-03-25 ENCOUNTER — Encounter: Payer: Self-pay | Admitting: Family Medicine

## 2023-03-25 ENCOUNTER — Other Ambulatory Visit (HOSPITAL_COMMUNITY): Payer: Self-pay

## 2023-03-25 DIAGNOSIS — B001 Herpesviral vesicular dermatitis: Secondary | ICD-10-CM | POA: Insufficient documentation

## 2023-03-25 MED ORDER — VALACYCLOVIR HCL 1 G PO TABS
2000.0000 mg | ORAL_TABLET | Freq: Two times a day (BID) | ORAL | 2 refills | Status: AC
  Filled 2023-03-25: qty 20, 5d supply, fill #0

## 2023-05-14 ENCOUNTER — Other Ambulatory Visit (HOSPITAL_COMMUNITY): Payer: Self-pay

## 2023-06-12 ENCOUNTER — Other Ambulatory Visit (HOSPITAL_COMMUNITY): Payer: Self-pay

## 2023-09-30 ENCOUNTER — Other Ambulatory Visit (HOSPITAL_COMMUNITY): Payer: Self-pay

## 2023-12-30 ENCOUNTER — Other Ambulatory Visit (HOSPITAL_COMMUNITY): Payer: Self-pay

## 2023-12-30 ENCOUNTER — Other Ambulatory Visit: Payer: Self-pay | Admitting: Family Medicine

## 2023-12-30 MED ORDER — HYDROCHLOROTHIAZIDE 12.5 MG PO CAPS
12.5000 mg | ORAL_CAPSULE | Freq: Every day | ORAL | 3 refills | Status: AC
  Filled 2023-12-30: qty 90, 90d supply, fill #0
  Filled 2024-04-06: qty 90, 90d supply, fill #1
  Filled 2024-09-03: qty 90, 90d supply, fill #2

## 2023-12-31 LAB — BASIC METABOLIC PANEL WITH GFR
BUN: 13 (ref 4–21)
Chloride: 100 (ref 99–108)
Glucose: 77
Potassium: 4.1 meq/L (ref 3.5–5.1)
Sodium: 140 (ref 137–147)

## 2023-12-31 LAB — COMPREHENSIVE METABOLIC PANEL WITH GFR
Albumin: 4.5 (ref 3.5–5.0)
Calcium: 9.6 (ref 8.7–10.7)
eGFR: 90

## 2023-12-31 LAB — HEPATIC FUNCTION PANEL
ALT: 19 U/L (ref 10–40)
AST: 37 (ref 14–40)

## 2023-12-31 LAB — TSH: TSH: 1.66 (ref 0.41–5.90)

## 2023-12-31 LAB — PSA: PSA: 2.1

## 2023-12-31 LAB — CBC AND DIFFERENTIAL
Hemoglobin: 14.7 (ref 13.5–17.5)
Platelets: 158 10*3/uL (ref 150–400)
WBC: 5.1

## 2023-12-31 LAB — HEMOGLOBIN A1C: Hemoglobin A1C: 5.7

## 2024-01-01 LAB — LIPID PANEL
Cholesterol: 166 (ref 0–200)
HDL: 73 — AB (ref 35–70)
LDL Cholesterol: 81
Triglycerides: 59 (ref 40–160)

## 2024-01-26 ENCOUNTER — Encounter: Payer: Self-pay | Admitting: Family Medicine

## 2024-01-26 ENCOUNTER — Encounter: Payer: 59 | Admitting: Family Medicine

## 2024-01-26 ENCOUNTER — Ambulatory Visit (INDEPENDENT_AMBULATORY_CARE_PROVIDER_SITE_OTHER): Admitting: Family Medicine

## 2024-01-26 VITALS — BP 110/70 | HR 52 | Temp 97.7°F | Ht 64.0 in | Wt 150.6 lb

## 2024-01-26 DIAGNOSIS — D693 Immune thrombocytopenic purpura: Secondary | ICD-10-CM | POA: Diagnosis not present

## 2024-01-26 DIAGNOSIS — I10 Essential (primary) hypertension: Secondary | ICD-10-CM

## 2024-01-26 DIAGNOSIS — B001 Herpesviral vesicular dermatitis: Secondary | ICD-10-CM | POA: Diagnosis not present

## 2024-01-26 DIAGNOSIS — E559 Vitamin D deficiency, unspecified: Secondary | ICD-10-CM | POA: Diagnosis not present

## 2024-01-26 DIAGNOSIS — Z0000 Encounter for general adult medical examination without abnormal findings: Secondary | ICD-10-CM

## 2024-01-26 NOTE — Progress Notes (Signed)
 Subjective  Chief Complaint  Patient presents with   Annual Exam    Pt here for Annual Exam and is not currently fasting     HPI: Louis Jennings is a 43 y.o. male who presents to [facility] [place] today for a Male Wellness Visit. He also has the concerns and/or needs as listed above in the chief complaint. These will be addressed in addition to the Health Maintenance Visit.   Wellness Visit: annual visit with health maintenance review and exam   HM: healthy lifestyle; stressful work. Doing well overall. Reviewed labs: abstracted.   Body mass index is 25.85 kg/m. Wt Readings from Last 3 Encounters:  01/26/24 150 lb 9.6 oz (68.3 kg)  01/23/23 149 lb 12.8 oz (67.9 kg)  10/25/22 156 lb 3.2 oz (70.9 kg)   Chronic disease management visit and/or acute problem visit: Discussed the use of AI scribe software for clinical note transcription with the patient, who gave verbal consent to proceed.  History of Present Illness Louis Jennings is a 43 year old male who presents with persistent cough.  He has a persistent cough, though the duration is unspecified. There are no associated symptoms detailed.  He notes a slight decrease in weight, currently around 149 pounds, which he attributes to feeling 'down all the time'. Despite this, he continues to exercise and feels good overall.  His blood pressure is typically between 110-120 mmHg systolic at home, though it is noted to be higher than usual. He attributes fluctuations to stress and physical activity, with decreases after exercise and relaxation. He is currently taking a diuretic for blood pressure management.  His A1c level has decreased from 5.9 to 5.7. There is a family history of prediabetes, as his mother is prediabetic with an A1c of 5.9. He discusses dietary habits, mentioning poor food choices at work and the need to monitor sugar intake.  He experienced a knee injury in December while training for a marathon,  leading him to switch from running to using a stair stepper for exercise. He reports improvement in his knee condition but has not returned to running.   Assessment  1. Annual physical exam   2. Essential hypertension   3. Idiopathic thrombocytopenia (HCC)   4. Recurrent cold sores   5. Vitamin D  deficiency      Plan  Male Wellness Visit: Age appropriate Health Maintenance and Prevention measures were discussed with patient. Included topics are cancer screening recommendations, ways to keep healthy (see AVS) including dietary and exercise recommendations, regular eye and dental care, use of seat belts, and avoidance of moderate alcohol use and tobacco use.  BMI: discussed patient's BMI and encouraged positive lifestyle modifications to help get to or maintain a target BMI. HM needs and immunizations were addressed and ordered. See below for orders. See HM and immunization section for updates. Routine labs and screening tests ordered including cmp, cbc and lipids where appropriate. Discussed recommendations regarding Vit D and calcium supplementation (see AVS)  Chronic disease f/u and/or acute problem visit: (deemed necessary to be done in addition to the wellness visit): HTN is controlled.  Ifg: monitor carbohydrates. Avoid processed sugars. Platelets are stable  Follow up: 12 mo for cpe [facility] side effects, risks, benefits, and alternatives for medications and treatment plan prescribed today were discussed, and the patient expressed understanding of the given instructions. Patient is instructed to call or message via MyChart if he/she has any questions or concerns regarding our treatment plan.  No barriers to understanding were identified. We discussed Red Flag symptoms and signs in detail. Patient expressed understanding regarding what to do in case of urgent or emergency type symptoms.  Medication list was reconciled, printed and provided to the patient in AVS. Patient instructions and  summary information was reviewed with the patient as documented in the AVS. This note was prepared with assistance of Dragon voice recognition software. Occasional wrong-word or sound-a-like substitutions may have occurred due to the inherent limitations of voice recognition software  No orders of the defined types were placed in this encounter.  No orders of the defined types were placed in this encounter.    Patient Active Problem List   Diagnosis Date Noted   Recurrent cold sores 03/25/2023   Essential hypertension 01/23/2023   Sinus bradycardia, chronic 10/22/2021   Vitamin D  deficiency 07/07/2015   Idiopathic thrombocytopenia (HCC) 07/07/2015   Health Maintenance  Topic Date Due   COVID-19 Vaccine (1 - 2024-25 season) 02/11/2024 (Originally 05/11/2023)   INFLUENZA VACCINE  04/09/2024   DTaP/Tdap/Td (8 - Td or Tdap) 10/23/2031   Hepatitis C Screening  Completed   HIV Screening  Completed   HPV VACCINES  Aged Out   Meningococcal B Vaccine  Aged Out   Immunization History  Administered Date(s) Administered   Hepatitis B 11/07/2003, 10/23/2007, 06/21/2008   Influenza,inj,Quad PF,6+ Mos 07/14/2014, 05/24/2022   Influenza-Unspecified 10/21/2007, 08/22/2008, 08/22/2008, 07/19/2013, 06/24/2015, 05/21/2017, 05/28/2018, 05/10/2021, 05/30/2023   MMR 07/01/2007, 02/08/2015   PPD Test 12/09/2013   Td 10/21/2007, 01/01/2008, 06/22/2008   Tdap 09/10/2007, 10/21/2007, 09/09/2010, 10/22/2021   We updated and reviewed the patient's past history in detail and it is documented below. Allergies: Patient has no known allergies. Past Medical History  has a past medical history of Sinus bradycardia, chronic (10/22/2021), Thrombocytopenia (HCC), and Vitamin D  deficiency. Past Surgical History Patient  has no past surgical history on file. Social History Patient  reports that he has never smoked. He has never used smokeless tobacco. He reports that he does not drink alcohol and does not use  drugs. Family History family history includes Healthy in his daughter and son. Review of Systems: Constitutional: negative for fever or malaise Ophthalmic: negative for photophobia, double vision or loss of vision Cardiovascular: negative for chest pain, dyspnea on exertion, or new LE swelling Respiratory: negative for SOB or persistent cough Gastrointestinal: negative for abdominal pain, change in bowel habits or melena Genitourinary: negative for dysuria or gross hematuria Musculoskeletal: negative for new gait disturbance or muscular weakness Integumentary: negative for new or persistent rashes Neurological: negative for TIA or stroke symptoms Psychiatric: negative for SI or delusions Allergic/Immunologic: negative for hives  Patient Care Team    Relationship Specialty Notifications Start End  Luevenia Saha, MD PCP - General Family Medicine  10/30/17    Objective  Vitals: BP 110/70 Comment: by home readings  Pulse (!) 52   Temp 97.7 F (36.5 C)   Ht 5\' 4"  (1.626 m)   Wt 150 lb 9.6 oz (68.3 kg)   SpO2 98%   BMI 25.85 kg/m  General:  Well developed, well nourished, no acute distress  Psych:  Alert and orientedx3,normal mood and affect HEENT:  Normocephalic, atraumatic, non-icteric sclera,  oropharynx is clear without mass or exudate, supple neck without adenopathy, or thyromegaly Cardiovascular:  Normal S1, S2, RRR without gallop, rub or murmur,  Respiratory:  Good breath sounds bilaterally, CTAB with normal respiratory effort Gastrointestinal: normal bowel sounds, soft, non-tender, no noted masses. No  HSM MSK: Joints are without erythema or swelling.  Skin:  Warm, no rashes Neurologic:    Mental status is normal.  Gross motor and sensory exams are normal. Stable gait. No tremor   Abstract on 01/26/2024  Component Date Value Ref Range Status   Hemoglobin 12/31/2023 14.7  13.5 - 17.5 Final   Platelets 12/31/2023 158  150 - 400 K/uL Final   WBC 12/31/2023 5.1   Final    Glucose 12/31/2023 77   Final   BUN 12/31/2023 13  4 - 21 Final   Potassium 12/31/2023 4.1  3.5 - 5.1 mEq/L Final   Sodium 12/31/2023 140  137 - 147 Final   Chloride 12/31/2023 100  99 - 108 Final   eGFR 12/31/2023 90   Final   Calcium 12/31/2023 9.6  8.7 - 10.7 Final   Albumin 12/31/2023 4.5  3.5 - 5.0 Final   Triglycerides 12/31/2023 59  40 - 160 Final   Cholesterol 12/31/2023 166  0 - 200 Final   HDL 12/31/2023 73 (A)  35 - 70 Final   LDL Cholesterol 12/31/2023 81   Final   ALT 12/31/2023 19  10 - 40 U/L Final   AST 12/31/2023 37  14 - 40 Final   Hemoglobin A1C 12/31/2023 5.7   Final   PSA 12/31/2023 2.1   Final   TSH 12/31/2023 1.66  0.41 - 5.90 Final

## 2024-01-26 NOTE — Patient Instructions (Signed)
 Please return in 12 months for your annual complete physical; please come fasting.  ? ?If you have any questions or concerns, please don't hesitate to send me a message via MyChart or call the office at [phone]. Thank you for visiting with [place] today! It's our pleasure caring for you.  ?

## 2024-02-17 DIAGNOSIS — H5213 Myopia, bilateral: Secondary | ICD-10-CM | POA: Diagnosis not present

## 2024-02-17 DIAGNOSIS — H52201 Unspecified astigmatism, right eye: Secondary | ICD-10-CM | POA: Diagnosis not present

## 2024-05-04 ENCOUNTER — Other Ambulatory Visit (HOSPITAL_COMMUNITY): Payer: Self-pay

## 2024-05-04 MED ORDER — DEXAMETHASONE 4 MG PO TABS
4.0000 mg | ORAL_TABLET | Freq: Three times a day (TID) | ORAL | 0 refills | Status: AC
  Filled 2024-05-04: qty 9, 3d supply, fill #0

## 2024-05-04 MED ORDER — CHLORHEXIDINE GLUCONATE 0.12 % MT SOLN
OROMUCOSAL | 0 refills | Status: AC
  Filled 2024-05-04: qty 473, 15d supply, fill #0

## 2024-05-04 MED ORDER — AMOXICILLIN 500 MG PO CAPS
500.0000 mg | ORAL_CAPSULE | Freq: Three times a day (TID) | ORAL | 0 refills | Status: AC
  Filled 2024-05-04: qty 21, 7d supply, fill #0

## 2024-05-04 MED ORDER — IBUPROFEN 400 MG PO TABS
400.0000 mg | ORAL_TABLET | ORAL | 0 refills | Status: AC | PRN
  Filled 2024-05-04: qty 30, 5d supply, fill #0
